# Patient Record
Sex: Male | Born: 1955 | Race: White | Hispanic: No | Marital: Married | Smoking: Never smoker
Health system: Southern US, Community
[De-identification: ages and names within clinical notes are randomized; demographics above are authoritative.]

## PROBLEM LIST (undated history)

## (undated) DIAGNOSIS — I10 Essential (primary) hypertension: Secondary | ICD-10-CM

## (undated) DIAGNOSIS — G4730 Sleep apnea, unspecified: Secondary | ICD-10-CM

## (undated) DIAGNOSIS — E7800 Pure hypercholesterolemia, unspecified: Secondary | ICD-10-CM

## (undated) DIAGNOSIS — E119 Type 2 diabetes mellitus without complications: Secondary | ICD-10-CM

---

## 2017-08-19 ENCOUNTER — Other Ambulatory Visit: Payer: Self-pay

## 2017-08-19 ENCOUNTER — Emergency Department (HOSPITAL_COMMUNITY): Payer: No Typology Code available for payment source

## 2017-08-19 ENCOUNTER — Encounter (HOSPITAL_COMMUNITY): Payer: Self-pay | Admitting: *Deleted

## 2017-08-19 ENCOUNTER — Inpatient Hospital Stay (HOSPITAL_COMMUNITY)
Admission: EM | Admit: 2017-08-19 | Discharge: 2017-08-22 | DRG: 872 | Disposition: A | Discharge status: Home / Self Care | Payer: No Typology Code available for payment source | Attending: Internal Medicine | Admitting: Internal Medicine

## 2017-08-19 DIAGNOSIS — G4733 Obstructive sleep apnea (adult) (pediatric): Secondary | ICD-10-CM | POA: Diagnosis present

## 2017-08-19 DIAGNOSIS — L03115 Cellulitis of right lower limb: Secondary | ICD-10-CM | POA: Diagnosis present

## 2017-08-19 DIAGNOSIS — N390 Urinary tract infection, site not specified: Secondary | ICD-10-CM | POA: Diagnosis not present

## 2017-08-19 DIAGNOSIS — E119 Type 2 diabetes mellitus without complications: Secondary | ICD-10-CM

## 2017-08-19 DIAGNOSIS — Z7984 Long term (current) use of oral hypoglycemic drugs: Secondary | ICD-10-CM

## 2017-08-19 DIAGNOSIS — Z7982 Long term (current) use of aspirin: Secondary | ICD-10-CM

## 2017-08-19 DIAGNOSIS — I10 Essential (primary) hypertension: Secondary | ICD-10-CM | POA: Diagnosis present

## 2017-08-19 DIAGNOSIS — A419 Sepsis, unspecified organism: Secondary | ICD-10-CM | POA: Diagnosis present

## 2017-08-19 DIAGNOSIS — E7800 Pure hypercholesterolemia, unspecified: Secondary | ICD-10-CM | POA: Diagnosis present

## 2017-08-19 DIAGNOSIS — N400 Enlarged prostate without lower urinary tract symptoms: Secondary | ICD-10-CM | POA: Diagnosis present

## 2017-08-19 DIAGNOSIS — Z6841 Body Mass Index (BMI) 40.0 and over, adult: Secondary | ICD-10-CM

## 2017-08-19 DIAGNOSIS — I4581 Long QT syndrome: Secondary | ICD-10-CM | POA: Diagnosis present

## 2017-08-19 DIAGNOSIS — Z79899 Other long term (current) drug therapy: Secondary | ICD-10-CM

## 2017-08-19 DIAGNOSIS — E876 Hypokalemia: Secondary | ICD-10-CM | POA: Diagnosis present

## 2017-08-19 DIAGNOSIS — G4730 Sleep apnea, unspecified: Secondary | ICD-10-CM | POA: Diagnosis present

## 2017-08-19 HISTORY — DX: Pure hypercholesterolemia, unspecified: E78.00

## 2017-08-19 HISTORY — DX: Essential (primary) hypertension: I10

## 2017-08-19 HISTORY — DX: Sleep apnea, unspecified: G47.30

## 2017-08-19 HISTORY — DX: Type 2 diabetes mellitus without complications: E11.9

## 2017-08-19 LAB — CBC
HCT: 41.7 % (ref 39.0–52.0)
Hemoglobin: 14.6 g/dL (ref 13.0–17.0)
MCH: 31 pg (ref 26.0–34.0)
MCHC: 35 g/dL (ref 30.0–36.0)
MCV: 88.5 fL (ref 78.0–100.0)
PLATELETS: 161 10*3/uL (ref 150–400)
RBC: 4.71 MIL/uL (ref 4.22–5.81)
RDW: 13.1 % (ref 11.5–15.5)
WBC: 20.1 10*3/uL — ABNORMAL HIGH (ref 4.0–10.5)

## 2017-08-19 LAB — BASIC METABOLIC PANEL
Anion gap: 10 (ref 5–15)
BUN: 18 mg/dL (ref 6–20)
CO2: 25 mmol/L (ref 22–32)
CREATININE: 0.94 mg/dL (ref 0.61–1.24)
Calcium: 9.4 mg/dL (ref 8.9–10.3)
Chloride: 103 mmol/L (ref 101–111)
GFR calc non Af Amer: >60 mL/min (ref >60)
Glucose, Bld: 193 mg/dL — ABNORMAL HIGH (ref 65–99)
Potassium: 3.4 mmol/L — ABNORMAL LOW (ref 3.5–5.1)
Sodium: 138 mmol/L (ref 135–145)

## 2017-08-19 LAB — URINALYSIS, ROUTINE W REFLEX MICROSCOPIC
Bilirubin Urine: NEGATIVE
Glucose, UA: NEGATIVE mg/dL
Hgb urine dipstick: NEGATIVE
KETONES UR: NEGATIVE mg/dL
Nitrite: NEGATIVE
PH: 5 (ref 5.0–8.0)
PROTEIN: NEGATIVE mg/dL
Specific Gravity, Urine: 1.017 (ref 1.005–1.030)

## 2017-08-19 LAB — CBG MONITORING, ED: GLUCOSE-CAPILLARY: 191 mg/dL — AB (ref 65–99)

## 2017-08-19 MED ORDER — SODIUM CHLORIDE 0.9 % IV BOLUS (SEPSIS)
1000.0000 mL | Freq: Once | INTRAVENOUS | Status: AC
  Administered 2017-08-20: 1000 mL via INTRAVENOUS

## 2017-08-19 MED ORDER — SODIUM CHLORIDE 0.9 % IV SOLN
2.0000 g | INTRAVENOUS | Status: DC
  Administered 2017-08-20: 2 g via INTRAVENOUS
  Filled 2017-08-19: qty 20

## 2017-08-19 MED ORDER — SODIUM CHLORIDE 0.9 % IV BOLUS (SEPSIS): 1000.0000 mL | Freq: Once | INTRAVENOUS | Status: DC

## 2017-08-19 NOTE — ED Notes (Signed)
Pt has blue top and gold top in main lab if needed

## 2017-08-19 NOTE — ED Triage Notes (Signed)
Pt's family reports waking up this am with no energy and weakness.  Pt reports nausea and fever.  Went to the UC with a 102 fever.  He reports having chills all day today.  Pt's family reports having his urine collected at the UC and was found to have WBC in his urine and instructed him to come to the ED.  Reports the UC heard "crackling in his lungs".  She reports pt having SOB as well.

## 2017-08-19 NOTE — ED Notes (Signed)
Pt reported having a fever of 102 at UC and they did not receive any medicine for the fever. Pt afebrile upon arrival. Pt tachypnic at 30-38 RR. NS on monitor.

## 2017-08-20 ENCOUNTER — Other Ambulatory Visit: Payer: Self-pay

## 2017-08-20 ENCOUNTER — Inpatient Hospital Stay (HOSPITAL_COMMUNITY): Payer: No Typology Code available for payment source

## 2017-08-20 DIAGNOSIS — A419 Sepsis, unspecified organism: Secondary | ICD-10-CM | POA: Diagnosis present

## 2017-08-20 DIAGNOSIS — N400 Enlarged prostate without lower urinary tract symptoms: Secondary | ICD-10-CM | POA: Diagnosis present

## 2017-08-20 DIAGNOSIS — I10 Essential (primary) hypertension: Secondary | ICD-10-CM | POA: Diagnosis present

## 2017-08-20 DIAGNOSIS — N3000 Acute cystitis without hematuria: Secondary | ICD-10-CM | POA: Diagnosis not present

## 2017-08-20 DIAGNOSIS — E7800 Pure hypercholesterolemia, unspecified: Secondary | ICD-10-CM | POA: Diagnosis present

## 2017-08-20 DIAGNOSIS — Z7982 Long term (current) use of aspirin: Secondary | ICD-10-CM | POA: Diagnosis not present

## 2017-08-20 DIAGNOSIS — Z79899 Other long term (current) drug therapy: Secondary | ICD-10-CM | POA: Diagnosis not present

## 2017-08-20 DIAGNOSIS — I4581 Long QT syndrome: Secondary | ICD-10-CM | POA: Diagnosis present

## 2017-08-20 DIAGNOSIS — G4730 Sleep apnea, unspecified: Secondary | ICD-10-CM | POA: Diagnosis not present

## 2017-08-20 DIAGNOSIS — E876 Hypokalemia: Secondary | ICD-10-CM | POA: Diagnosis present

## 2017-08-20 DIAGNOSIS — E119 Type 2 diabetes mellitus without complications: Secondary | ICD-10-CM | POA: Diagnosis present

## 2017-08-20 DIAGNOSIS — Z6841 Body Mass Index (BMI) 40.0 and over, adult: Secondary | ICD-10-CM | POA: Diagnosis not present

## 2017-08-20 DIAGNOSIS — N390 Urinary tract infection, site not specified: Secondary | ICD-10-CM | POA: Diagnosis present

## 2017-08-20 DIAGNOSIS — G4733 Obstructive sleep apnea (adult) (pediatric): Secondary | ICD-10-CM | POA: Diagnosis present

## 2017-08-20 DIAGNOSIS — Z7984 Long term (current) use of oral hypoglycemic drugs: Secondary | ICD-10-CM | POA: Diagnosis not present

## 2017-08-20 DIAGNOSIS — R609 Edema, unspecified: Secondary | ICD-10-CM | POA: Diagnosis not present

## 2017-08-20 DIAGNOSIS — L03115 Cellulitis of right lower limb: Secondary | ICD-10-CM | POA: Diagnosis present

## 2017-08-20 LAB — HIV ANTIBODY (ROUTINE TESTING W REFLEX): HIV SCREEN 4TH GENERATION: NONREACTIVE

## 2017-08-20 LAB — BLOOD GAS, VENOUS
ACID-BASE EXCESS: 1.1 mmol/L (ref 0.0–2.0)
BICARBONATE: 25.7 mmol/L (ref 20.0–28.0)
FIO2: 21
O2 SAT: 33 %
PATIENT TEMPERATURE: 98.6
pCO2, Ven: 43.3 mmHg — ABNORMAL LOW (ref 44.0–60.0)
pH, Ven: 7.392 (ref 7.250–7.430)

## 2017-08-20 LAB — I-STAT CG4 LACTIC ACID, ED
Lactic Acid, Venous: 2.85 mmol/L (ref 0.5–1.9)
Lactic Acid, Venous: 3.18 mmol/L (ref 0.5–1.9)

## 2017-08-20 LAB — BRAIN NATRIURETIC PEPTIDE: B Natriuretic Peptide: 178.4 pg/mL — ABNORMAL HIGH (ref 0.0–100.0)

## 2017-08-20 LAB — BASIC METABOLIC PANEL
Anion gap: 12 (ref 5–15)
BUN: 18 mg/dL (ref 6–20)
CO2: 27 mmol/L (ref 22–32)
Calcium: 9.5 mg/dL (ref 8.9–10.3)
Chloride: 101 mmol/L (ref 101–111)
Creatinine, Ser: 1.08 mg/dL (ref 0.61–1.24)
GFR calc Af Amer: >60 mL/min (ref >60)
GFR calc non Af Amer: >60 mL/min (ref >60)
GLUCOSE: 200 mg/dL — AB (ref 65–99)
POTASSIUM: 3.6 mmol/L (ref 3.5–5.1)
Sodium: 140 mmol/L (ref 135–145)

## 2017-08-20 LAB — CBC
HEMATOCRIT: 41.2 % (ref 39.0–52.0)
Hemoglobin: 14 g/dL (ref 13.0–17.0)
MCH: 30.8 pg (ref 26.0–34.0)
MCHC: 34 g/dL (ref 30.0–36.0)
MCV: 90.5 fL (ref 78.0–100.0)
Platelets: 164 10*3/uL (ref 150–400)
RBC: 4.55 MIL/uL (ref 4.22–5.81)
RDW: 13.5 % (ref 11.5–15.5)
WBC: 21.3 10*3/uL — ABNORMAL HIGH (ref 4.0–10.5)

## 2017-08-20 LAB — CBG MONITORING, ED
GLUCOSE-CAPILLARY: 166 mg/dL — AB (ref 65–99)
GLUCOSE-CAPILLARY: 154 mg/dL — AB (ref 65–99)

## 2017-08-20 LAB — MAGNESIUM: Magnesium: 1.7 mg/dL (ref 1.7–2.4)

## 2017-08-20 LAB — GLUCOSE, CAPILLARY
GLUCOSE-CAPILLARY: 149 mg/dL — AB (ref 65–99)
GLUCOSE-CAPILLARY: 178 mg/dL — AB (ref 65–99)

## 2017-08-20 LAB — MRSA PCR SCREENING: MRSA by PCR: NEGATIVE

## 2017-08-20 LAB — LACTIC ACID, PLASMA: Lactic Acid, Venous: 3.1 mmol/L (ref 0.5–1.9)

## 2017-08-20 MED ORDER — ONDANSETRON HCL 4 MG PO TABS: 4.0000 mg | ORAL_TABLET | Freq: Four times a day (QID) | ORAL | Status: DC | PRN

## 2017-08-20 MED ORDER — VANCOMYCIN HCL IN DEXTROSE 1-5 GM/200ML-% IV SOLN
1000.0000 mg | Freq: Once | INTRAVENOUS | Status: AC
  Administered 2017-08-20: 1000 mg via INTRAVENOUS
  Filled 2017-08-20: qty 200

## 2017-08-20 MED ORDER — INSULIN ASPART 100 UNIT/ML ~~LOC~~ SOLN
0.0000 [IU] | Freq: Three times a day (TID) | SUBCUTANEOUS | Status: DC
  Administered 2017-08-20: 2 [IU] via SUBCUTANEOUS
  Administered 2017-08-20: 1 [IU] via SUBCUTANEOUS
  Administered 2017-08-20: 2 [IU] via SUBCUTANEOUS
  Administered 2017-08-21 (×2): 1 [IU] via SUBCUTANEOUS
  Administered 2017-08-21: 2 [IU] via SUBCUTANEOUS
  Administered 2017-08-22: 1 [IU] via SUBCUTANEOUS
  Administered 2017-08-22: 2 [IU] via SUBCUTANEOUS
  Filled 2017-08-20 (×2): qty 1

## 2017-08-20 MED ORDER — MAGNESIUM SULFATE 2 GM/50ML IV SOLN
2.0000 g | Freq: Once | INTRAVENOUS | Status: AC
  Administered 2017-08-20: 2 g via INTRAVENOUS
  Filled 2017-08-20: qty 50

## 2017-08-20 MED ORDER — SODIUM CHLORIDE 0.9 % IV BOLUS
1000.0000 mL | Freq: Once | INTRAVENOUS | Status: DC
  Administered 2017-08-20: 1000 mL via INTRAVENOUS

## 2017-08-20 MED ORDER — POTASSIUM CHLORIDE IN NACL 20-0.9 MEQ/L-% IV SOLN: INTRAVENOUS | Status: DC

## 2017-08-20 MED ORDER — ONDANSETRON HCL 4 MG/2ML IJ SOLN: 4.0000 mg | Freq: Four times a day (QID) | INTRAMUSCULAR | Status: DC | PRN

## 2017-08-20 MED ORDER — LISINOPRIL 20 MG PO TABS
40.0000 mg | ORAL_TABLET | Freq: Every day | ORAL | Status: DC
  Administered 2017-08-20 – 2017-08-21 (×2): 40 mg via ORAL
  Filled 2017-08-20 (×3): qty 2

## 2017-08-20 MED ORDER — ROSUVASTATIN CALCIUM 20 MG PO TABS
20.0000 mg | ORAL_TABLET | Freq: Every day | ORAL | Status: DC
Start: 2017-08-20 — End: 2017-08-22
  Administered 2017-08-20 – 2017-08-21 (×2): 20 mg via ORAL
  Filled 2017-08-20 (×3): qty 1

## 2017-08-20 MED ORDER — SODIUM CHLORIDE 0.9 % IV BOLUS
500.0000 mL | Freq: Once | INTRAVENOUS | Status: AC
  Administered 2017-08-20: 500 mL via INTRAVENOUS

## 2017-08-20 MED ORDER — POTASSIUM CHLORIDE CRYS ER 20 MEQ PO TBCR
40.0000 meq | EXTENDED_RELEASE_TABLET | Freq: Once | ORAL | Status: AC
  Administered 2017-08-20: 40 meq via ORAL
  Filled 2017-08-20: qty 2

## 2017-08-20 MED ORDER — ACETAMINOPHEN 325 MG PO TABS: 650.0000 mg | ORAL_TABLET | Freq: Four times a day (QID) | ORAL | Status: DC | PRN

## 2017-08-20 MED ORDER — VENLAFAXINE HCL ER 150 MG PO CP24
150.0000 mg | ORAL_CAPSULE | Freq: Every day | ORAL | Status: DC
  Administered 2017-08-20 – 2017-08-22 (×3): 150 mg via ORAL
  Filled 2017-08-20: qty 2
  Filled 2017-08-20 (×2): qty 1

## 2017-08-20 MED ORDER — DULAGLUTIDE 1.5 MG/0.5ML ~~LOC~~ SOAJ: 1.5000 mg | SUBCUTANEOUS | Status: DC

## 2017-08-20 MED ORDER — ENOXAPARIN SODIUM 80 MG/0.8ML ~~LOC~~ SOLN
0.5000 mg/kg | SUBCUTANEOUS | Status: DC
  Administered 2017-08-21 – 2017-08-22 (×2): 75 mg via SUBCUTANEOUS
  Filled 2017-08-20 (×2): qty 0.8
  Filled 2017-08-20: qty 0.75

## 2017-08-20 MED ORDER — VENLAFAXINE HCL ER 75 MG PO CP24: 75.0000 mg | ORAL_CAPSULE | ORAL | Status: DC

## 2017-08-20 MED ORDER — TAMSULOSIN HCL 0.4 MG PO CAPS
0.4000 mg | ORAL_CAPSULE | Freq: Every day | ORAL | Status: DC
  Administered 2017-08-20 – 2017-08-21 (×2): 0.4 mg via ORAL
  Filled 2017-08-20 (×2): qty 1

## 2017-08-20 MED ORDER — VANCOMYCIN HCL 10 G IV SOLR
1250.0000 mg | Freq: Two times a day (BID) | INTRAVENOUS | Status: DC
  Administered 2017-08-20 – 2017-08-22 (×5): 1250 mg via INTRAVENOUS
  Filled 2017-08-20 (×5): qty 1250

## 2017-08-20 MED ORDER — ACETAMINOPHEN 500 MG PO TABS
1000.0000 mg | ORAL_TABLET | Freq: Once | ORAL | Status: AC
  Administered 2017-08-20: 1000 mg via ORAL
  Filled 2017-08-20: qty 2

## 2017-08-20 MED ORDER — TAMSULOSIN HCL 0.4 MG PO CAPS
0.1600 mg | ORAL_CAPSULE | Freq: Every day | ORAL | Status: DC
  Filled 2017-08-20: qty 1

## 2017-08-20 MED ORDER — SODIUM CHLORIDE 0.9 % IV SOLN
2.0000 g | INTRAVENOUS | Status: DC
  Administered 2017-08-20 – 2017-08-21 (×2): 2 g via INTRAVENOUS
  Filled 2017-08-20 (×2): qty 2

## 2017-08-20 MED ORDER — FINASTERIDE 5 MG PO TABS
5.0000 mg | ORAL_TABLET | Freq: Every day | ORAL | Status: DC
  Administered 2017-08-20 – 2017-08-21 (×2): 5 mg via ORAL
  Filled 2017-08-20 (×3): qty 1

## 2017-08-20 MED ORDER — VENLAFAXINE HCL ER 75 MG PO CP24
75.0000 mg | ORAL_CAPSULE | Freq: Every day | ORAL | Status: DC
Start: 2017-08-20 — End: 2017-08-22
  Administered 2017-08-20 – 2017-08-21 (×2): 75 mg via ORAL
  Filled 2017-08-20 (×2): qty 1

## 2017-08-20 NOTE — ED Notes (Addendum)
Spoke with [facility] hospitalist who was made aware of LA 3.1; per hospitalist is reviewing chart for future needs/orders.

## 2017-08-20 NOTE — Progress Notes (Signed)
[name] is a 62 year old morbidly obese male with past medical history significant for type 2 diabetes, hypertension, BPH, who presented to the ED with complaints of fever chills dysuria and worsening urinary incontinence of 1 day duration.  Patient resides in [place] and is visiting here.  Admitted for sepsis secondary to UTI.  On examination this morning patient appears to have right lower extremity cellulitis.  Currently on IV vancomycin and IV ceftriaxone, will continue.  MRSA screening ordered.  Renal ultrasound unremarkable for hydronephrosis.  Personally reviewed EKG which revealed QTC 513.  Repeated twelve-lead EKG this morning.  No prior records of 2D echo.  Ordered BNP.  Self-reported having a stress test done 2 years ago which was unremarkable.  Please refer to H&P dictated by Dr. Mariea Craig on 08/20/2017 for further details of the assessment and plan..Marland Kitchen

## 2017-08-20 NOTE — ED Notes (Signed)
Date and time results received: 08/20/17 7:17 AM  Test: lactic acid Critical Value: 3.1  Name of Provider Notified: hospitalist paged  Orders Received? Or Actions Taken?: awaiting response

## 2017-08-20 NOTE — Progress Notes (Signed)
Pharmacy Antibiotic Note  [place] is a 62 y.o. male admitted on 08/19/2017 with sepsis.  Pharmacy has been consulted for vancomycin dosing.  Plan: Rocephin 2 gm IV q24h Vancomycin 1 Gm x1 then 1250 mg IV q12h for est AUC = 528 Using scr=1.08 Goal AUC = 400-500 F/u scr/cultures/evels  Height: 6' (182.9 cm) Weight: (!) 340 lb (154.2 kg) IBW/kg (Calculated) : 77.6  Temp (24hrs), Avg:100.2 F (37.9 C), Min:98.3 F (36.8 C), Max:102 F (38.9 C)  Recent Labs  Lab 08/19/17 2004 08/20/17 0026 08/20/17 0145  WBC 20.1*  --   --   CREATININE 0.94  --   --   LATICACIDVEN  --  3.18* 2.85*    Estimated Creatinine Clearance: 123.1 mL/min (by C-G formula based on SCr of 0.94 mg/dL).    Allergies  Allergen Reactions  . Bupropion Hives  . Canagliflozin     Other reaction(s): Other; see comment Yeast infections    Antimicrobials this admission: 6/11 rocephin >>  6/12 vancomycin >>   Dose adjustments this admission:   Microbiology results:  BCx:   UCx:    Sputum:    MRSA PCR:   Thank you for allowing pharmacy to be a part of this patient's care.  Lorenza EvangelistGreen, Jandiel Magallanes R 08/20/2017 4:09 AM

## 2017-08-20 NOTE — ED Notes (Signed)
Bed: WA29 Expected date:  Expected time:  Means of arrival:  Comments: ROOM 16

## 2017-08-20 NOTE — ED Provider Notes (Addendum)
[facility]-EMERGENCY DEPT Provider Note   CSN: [id number] Arrival date & time: 08/19/17  1903  Time seen 23:19 PM    History   Chief Complaint Chief Complaint  Patient presents with  . Weakness    HPI [place] is a 62 y.o. male.  HPI history obtained from patient and his wife.  She relates [place] from [place] on [place] 5 to attend a wedding on the seventh.  Patient has been doing well however this morning he started having chills that lasted for a couple hours.  He also complained of aching of his bones and hurting all over.  She states she noted he was taking shallow breaths and he had a mild cough and he told her he felt short of breath.  They were seen at an urgent care at 5 PM and were told he had white blood cells in the urine and that his lungs "sounded crackly" and they were told to come to the ED.  He complains of feeling weak.  He has had decreased appetite today.  He denies sore throat, rhinorrhea, sneezing, vomiting, or diarrhea.  He has had mild nausea.  He also however complains of dysuria and difficulty controlling his urine over the past 2 weeks.  He also describes frequency.  He has a history of prostate problems and he has been on tamsulosin for a while however his urologist added finasteride a few weeks ago.  Wife states patient has had sepsis twice in the past.  She states once they were in [place] and he [place] his leg at the zoo and she states he was very sick at that time.  He also had sepsis while they were at home and he was not as bad that time.  She states they did not find a source of the infection but he had a "infection in the bloodstream" and she thinks she remembers them saying he had staff.  Patient uses CPAP at night.  Past Medical History:  Diagnosis Date  . Diabetes mellitus without complication (HCC)   . Hypercholesterolemia   . Hypertension   . Sleep apnea     There are no active problems to display for this  patient.   History reviewed. No pertinent surgical history.      Home Medications    Prior to Admission medications   Medication Sig Start Date End Date Taking? Authorizing Provider  amLODipine (NORVASC) 10 MG tablet Take 10 mg by mouth at bedtime. 07/31/17  Yes [provider]  aspirin (ECOTRIN LOW STRENGTH) 81 MG EC tablet Take 81 mg by mouth at bedtime. 11/20/12  Yes [provider]  b complex vitamins tablet Take 1 tablet by mouth daily.   Yes [provider]  CALCIUM PO Take 1 tablet by mouth daily.   Yes [provider]  cholecalciferol (VITAMIN D) 1000 units tablet Take 5,000 Units by mouth daily.   Yes [provider]  Coenzyme Q10 (COQ-10) 400 MG CAPS Take 400 mg by mouth daily.   Yes [provider]  finasteride (PROSCAR) 5 MG tablet Take 5 mg by mouth at bedtime. 08/01/17  Yes [provider]  glucosamine-chondroitin 500-400 MG tablet Take 1 tablet by mouth daily.   Yes [provider]  hydrochlorothiazide (HYDRODIURIL) 25 MG tablet Take 25 mg by mouth daily. 08/09/17  Yes [provider]  Lactobacillus (ACIDOPHILUS) CAPS capsule Take 1 capsule by mouth daily.   Yes [provider]  lisinopril (  PRINIVIL,ZESTRIL) 40 MG tablet Take 40 mg by mouth at bedtime. 07/31/17  Yes [provider]  LUTEIN PO Take 1 capsule by mouth daily.   Yes [provider]  MAGNESIUM-POTASSIUM PO Take 3 capsules by mouth daily.   Yes [provider]  metFORMIN (GLUCOPHAGE-XR) 500 MG 24 hr tablet Take 2,000 mg by mouth every morning. 07/31/17  Yes [provider]  Misc Natural Products (BETA-SITOSTEROL PLANT STEROLS PO) Take 1 capsule by mouth at bedtime.   Yes [provider]  Omega-3 Fatty Acids (FISH OIL) 1000 MG CAPS Take 2 capsules by mouth daily.   Yes [provider]  psyllium (REGULOID) 0.52 g capsule Take 2.6 g by mouth daily.   Yes [provider]    rosuvastatin (CRESTOR) 20 MG tablet Take 20 mg by mouth daily. 07/04/17  Yes [provider]  tamsulosin (FLOMAX) 0.4 MG CAPS capsule Take 0.4 capsules by mouth at bedtime. 04/07/17  Yes [provider]  TRULICITY 1.5 MG/0.5ML SOPN Inject 1.5 mg into the skin once a week. 07/28/17  Yes [provider]  venlafaxine XR (EFFEXOR-XR) 75 MG 24 hr capsule Take 1-3 capsules by mouth as directed. 2 capsule in the AM and 1 capsule at night 07/04/17  Yes [provider]  zinc gluconate 50 MG tablet Take 50 mg by mouth daily.   Yes [provider]    Family History No family history on file.  Social History Social History   Tobacco Use  . Smoking status: Never Smoker  . Smokeless tobacco: Never Used  Substance Use Topics  . Alcohol use: Never    Frequency: Never  . Drug use: Never  lives at home  Lives with spouse Uses CPAP at night   Allergies   Bupropion and Canagliflozin   Review of Systems Review of Systems  All other systems reviewed and are negative.    Physical Exam Updated Vital Signs BP (!) 161/74   Pulse 94   Temp (!) 102 F (38.9 C) (Rectal)   Resp 15   Ht 6' (1.829 m)   Wt (!) 154.2 kg (340 lb)   SpO2 100%   BMI 46.11 kg/m   Vital signs normal except for tachycardia   Physical Exam  Constitutional: He is oriented to person, place, and time. He appears well-developed and well-nourished.  Non-toxic appearance. He does not appear ill. No distress.  obese  HENT:  Head: Normocephalic and atraumatic.  Right Ear: External ear normal.  Left Ear: External ear normal.  Nose: Nose normal. No mucosal edema or rhinorrhea.  Mouth/Throat: Oropharynx is clear and moist and mucous membranes are normal. No dental abscesses or uvula swelling.  Eyes: Pupils are equal, round, and reactive to light. Conjunctivae and EOM are normal.  Neck: Normal range of motion and full passive range of motion without pain. Neck supple.   Cardiovascular: Regular rhythm and normal heart sounds. Tachycardia present. Exam reveals no gallop and no friction rub.  No murmur heard. Pulmonary/Chest: Effort normal and breath sounds normal. Tachypnea noted. No respiratory distress. He has no wheezes. He has no rhonchi. He has no rales. He exhibits no tenderness and no crepitus.  Abdominal: Soft. Normal appearance and bowel sounds are normal. He exhibits no distension. There is no tenderness. There is no rebound and no guarding.  Musculoskeletal: Normal range of motion. He exhibits no edema or tenderness.  Moves all extremities well.   Neurological: He is alert and oriented to person, place, and time.  He has normal strength. No cranial nerve deficit.  Skin: Skin is warm, dry and intact. No rash noted. No erythema.  Pt's skin is grayish in color  Psychiatric: His affect is blunt. His speech is delayed. He is slowed.  Nursing note and vitals reviewed.    ED Treatments / Results  Labs (all labs ordered are listed, but only abnormal results are displayed) Results for orders placed or performed during the hospital encounter of 08/19/17  Basic metabolic panel  Result Value Ref Range   Sodium 138 135 - 145 mmol/L   Potassium 3.4 (L) 3.5 - 5.1 mmol/L   Chloride 103 101 - 111 mmol/L   CO2 25 22 - 32 mmol/L   Glucose, Bld 193 (H) 65 - 99 mg/dL   BUN 18 6 - 20 mg/dL   Creatinine, Ser 4.09 0.61 - 1.24 mg/dL   Calcium 9.4 8.9 - 81.1 mg/dL   GFR calc non Af Amer >60 >60 mL/min   GFR calc Af Amer >60 >60 mL/min   Anion gap 10 5 - 15  CBC  Result Value Ref Range   WBC 20.1 (H) 4.0 - 10.5 K/uL   RBC 4.71 4.22 - 5.81 MIL/uL   Hemoglobin 14.6 13.0 - 17.0 g/dL   HCT 91.4 78.2 - 95.6 %   MCV 88.5 78.0 - 100.0 fL   MCH 31.0 26.0 - 34.0 pg   MCHC 35.0 30.0 - 36.0 g/dL   RDW 21.3 08.6 - 57.8 %   Platelets 161 150 - 400 K/uL  Urinalysis, Routine w reflex microscopic  Result Value Ref Range   Color, Urine AMBER (A) YELLOW   APPearance  CLOUDY (A) CLEAR   Specific Gravity, Urine 1.017 1.005 - 1.030   pH 5.0 5.0 - 8.0   Glucose, UA NEGATIVE NEGATIVE mg/dL   Hgb urine dipstick NEGATIVE NEGATIVE   Bilirubin Urine NEGATIVE NEGATIVE   Ketones, ur NEGATIVE NEGATIVE mg/dL   Protein, ur NEGATIVE NEGATIVE mg/dL   Nitrite NEGATIVE NEGATIVE   Leukocytes, UA LARGE (A) NEGATIVE   RBC / HPF 6-10 0 - 5 RBC/hpf   WBC, UA >50 (H) 0 - 5 WBC/hpf   Bacteria, UA RARE (A) NONE SEEN   Squamous Epithelial / LPF 0-5 0 - 5   Mucus PRESENT   Blood gas, venous  Result Value Ref Range   FIO2 21.00    pH, Ven 7.392 7.250 - 7.430   pCO2, Ven 43.3 (L) 44.0 - 60.0 mmHg   pO2, Ven BELOW REPORTABLE RANGE 32.0 - 45.0 mmHg   Bicarbonate 25.7 20.0 - 28.0 mmol/L   Acid-Base Excess 1.1 0.0 - 2.0 mmol/L   O2 Saturation 33.0 %   Patient temperature 98.6    Collection site VEIN    Drawn by DRAWN BY RN    Sample type VENOUS   CBG monitoring, ED  Result Value Ref Range   Glucose-Capillary 191 (H) 65 - 99 mg/dL   Comment 1 Notify RN   I-Stat CG4 Lactic Acid, ED  (not at  [facility])  Result Value Ref Range   Lactic Acid, Venous 3.18 (HH) 0.5 - 1.9 mmol/L   Comment NOTIFIED PHYSICIAN   I-Stat CG4 Lactic Acid, ED  (not at  [facility])  Result Value Ref Range   Lactic Acid, Venous 2.85 (HH) 0.5 - 1.9 mmol/L   Comment NOTIFIED PHYSICIAN    Laboratory interpretation all normal except leukocytosis, possible UTI, elevated lactic acid, vBG is normal, improving LA    EKG EKG Interpretation  Date/Time:  Tuesday August 19 2017 19:52:22 EDT Ventricular Rate:  99 PR Interval:    QRS Duration: 96 QT Interval:  399 QTC Calculation: 513 R Axis:   142 Text Interpretation:  Sinus rhythm Right axis deviation Minimal ST elevation, anterior leads Prolonged QT interval No old tracing to compare Confirmed by Devoria Albe ([id number]) on 08/19/2017 11:28:18 PM   Radiology Dg Chest Port 1 View  Result Date: 08/20/2017 CLINICAL DATA:  Fever and sepsis.  Dyspnea. EXAM: PORTABLE  CHEST 1 VIEW COMPARISON:  None FINDINGS: Eventration of the right hemidiaphragm. Heart is top-normal in size. Minimal aortic atherosclerosis and mild central vascular congestion is seen. No pulmonary consolidation, effusion or pneumothorax is noted. The patient's chin obscures portions of the apices medially. No acute osseous abnormality. IMPRESSION: Borderline cardiomegaly with mild central vascular congestion. No alveolar consolidation. Electronically Signed   By: Tollie Eth M.D.   On: 08/20/2017 00:03    Procedures .Critical Care Performed by: Devoria Albe, MD Authorized by: Devoria Albe, MD   Critical care provider statement:    Critical care time (minutes):  36   Critical care was necessary to treat or prevent imminent or life-threatening deterioration of the following conditions:  Circulatory failure   Critical care was time spent personally by me on the following activities:  Discussions with consultants, examination of patient, obtaining history from patient or surrogate, ordering and review of laboratory studies, ordering and review of radiographic studies, pulse oximetry and re-evaluation of patient's condition   (including critical care time)  Medications Ordered in ED Medications  cefTRIAXone (ROCEPHIN) 2 g in sodium chloride 0.9 % 100 mL IVPB (0 g Intravenous Stopped 08/20/17 0047)  vancomycin (VANCOCIN) IVPB 1000 mg/200 mL premix (1,000 mg Intravenous New Bag/Given 08/20/17 0054)  sodium chloride 0.9 % bolus 1,000 mL (0 mLs Intravenous Stopped 08/20/17 0142)    And  sodium chloride 0.9 % bolus 1,000 mL (1,000 mLs Intravenous New Bag/Given 08/20/17 0022)    And  sodium chloride 0.9 % bolus 1,000 mL ( Intravenous Restarted 08/20/17 0018)    And  sodium chloride 0.9 % bolus 1,000 mL (1,000 mLs Intravenous New Bag/Given 08/20/17 0017)  acetaminophen (TYLENOL) tablet 1,000 mg (1,000 mg Oral Given 08/20/17 0054)     Initial Impression / Assessment and Plan / ED Course  I have reviewed  the triage vital signs and the nursing notes.  Pertinent labs & imaging results that were available during my care of the patient were reviewed by me and considered in my medical decision making (see chart for details).    When I saw patient code sepsis was ordered.  He was started on IV fluid bolus and started on antibiotics for urinary tract as source.  Chest x-ray was ordered to look for pulmonary source however I feel like his tachypnea as part of his sepsis and infection.  After reviewing his laboratory test results and thinking about his history vancomycin was added to his antibiotic regimen since the wife indicated he had staph in the past.  12:45 AM Dr Hosie Poisson, Critical Care, feels patient can be admitted by hospitalist.   01:18 AM Dr Mariea Clonts, hospitalist, will admit  01:25 AM pt color has improved, he is more alert and talkative now. Discussed his test results and he was being admitted. Wife has brought his CPAP to hospital.   Final Clinical Impressions(s) / ED Diagnoses   Final diagnoses:  Sepsis, due to unspecified organism [facility])  Urinary tract infection without hematuria, site unspecified  Plan admission   Devoria Albe, MD 08/20/17 2130    Devoria Albe, MD 08/20/17 Drinda Butts, MD 08/20/17 [phone]

## 2017-08-20 NOTE — H&P (Signed)
History and Physical    Carlos Craig ZOX:[id number] DOB: February 19, 1956 DOA: 08/19/2017  PCP: System, Pcp Not In   Patient coming from: Home  Chief Complaint: fever, dysuria  HPI: Carlos Craig is a 62 y.o. male with medical history significant for DM, HTN, BPH who presented to the ED today with complaints of fever and chills that started this morning.  Also dysuria and urinary frequency of 2 weeks duration.  Reports nausea but no vomiting, no abdominal pain change in bowel habits. Reports generalized body aches.  Patient reports some shortness of breath earlier which has resolved.  No cough.  Was seen at urgent care, yesterday , was subsequently sent to the ED.  [place] in [place], came down here- June 5th for a wedding, initially planned to travel back tomorrow. Denies alcohol or tobacco abuse.  Patient reports history of sepsis  A few years ago, was told it was staph.  ED Course: Temp 102, heart rate 80s to 102, tachypnea documented to 40, blood pressure systolic 130s to 811B, O2 sats greater than 94% on room air.  WBC- 20, hemoglobin 14.6, UA-large leukocytes, WBC, rare bacteria, portable chest x-ray-borderline cardiomegaly, mild central vascular congestion, no alveolar consolidation.  Blood cultures drawn in ED. patient was started on IV vancomycin(with history of staph infection) and ceftriaxone. 4L bolus given in ED. Hospitalist called to admit for sepsis secondary to UTI.   Review of Systems: As per HPI otherwise 10 point review of systems negative.  Past Medical History:  Diagnosis Date  . Diabetes mellitus without complication (HCC)   . Hypercholesterolemia   . Hypertension   . Sleep apnea    History reviewed. No pertinent surgical history.   reports that he has never smoked. He has never used smokeless tobacco. He reports that he does not drink alcohol or use drugs.  Allergies  Allergen Reactions  . Bupropion Hives  . Canagliflozin     Other reaction(s): Other; see  comment Yeast infections   Family history noncontributory.  Prior to Admission medications   Medication Sig Start Date End Date Taking? Authorizing Provider  amLODipine (NORVASC) 10 MG tablet Take 10 mg by mouth at bedtime. 07/31/17  Yes [provider]  aspirin (ECOTRIN LOW STRENGTH) 81 MG EC tablet Take 81 mg by mouth at bedtime. 11/20/12  Yes [provider]  b complex vitamins tablet Take 1 tablet by mouth daily.   Yes [provider]  CALCIUM PO Take 1 tablet by mouth daily.   Yes [provider]  cholecalciferol (VITAMIN D) 1000 units tablet Take 5,000 Units by mouth daily.   Yes [provider]  Coenzyme Q10 (COQ-10) 400 MG CAPS Take 400 mg by mouth daily.   Yes [provider]  finasteride (PROSCAR) 5 MG tablet Take 5 mg by mouth at bedtime. 08/01/17  Yes [provider]  glucosamine-chondroitin 500-400 MG tablet Take 1 tablet by mouth daily.   Yes [provider]  hydrochlorothiazide (HYDRODIURIL) 25 MG tablet Take 25 mg by mouth daily. 08/09/17  Yes [provider]  Lactobacillus (ACIDOPHILUS) CAPS capsule Take 1 capsule by mouth daily.   Yes [provider]  lisinopril (PRINIVIL,ZESTRIL) 40 MG tablet Take 40 mg by mouth at bedtime. 07/31/17  Yes [provider]  LUTEIN PO Take 1 capsule by mouth daily.   Yes [provider]  MAGNESIUM-POTASSIUM PO Take 3 capsules by mouth daily.   Yes [provider]  metFORMIN (GLUCOPHAGE-XR) 500 MG 24 hr tablet Take  2,000 mg by mouth every morning. 07/31/17  Yes [provider]  Misc Natural Products (BETA-SITOSTEROL PLANT STEROLS PO) Take 1 capsule by mouth at bedtime.   Yes [provider]  Omega-3 Fatty Acids (FISH OIL) 1000 MG CAPS Take 2 capsules by mouth daily.   Yes [provider]  psyllium (REGULOID) 0.52 g capsule Take 2.6 g by mouth daily.   Yes [provider]  rosuvastatin (CRESTOR) 20 MG  tablet Take 20 mg by mouth daily. 07/04/17  Yes [provider]  tamsulosin (FLOMAX) 0.4 MG CAPS capsule Take 0.4 capsules by mouth at bedtime. 04/07/17  Yes [provider]  TRULICITY 1.5 MG/0.5ML SOPN Inject 1.5 mg into the skin once a week. 07/28/17  Yes [provider]  venlafaxine XR (EFFEXOR-XR) 75 MG 24 hr capsule Take 1-3 capsules by mouth as directed. 2 capsule in the AM and 1 capsule at night 07/04/17  Yes [provider]  zinc gluconate 50 MG tablet Take 50 mg by mouth daily.   Yes [provider]    Physical Exam: Vitals:   08/20/17 0015 08/20/17 0030 08/20/17 0126 08/20/17 0251  BP:  (!) 156/79 132/67 140/72  Pulse:  88 (!) 102 85  Resp:  (!) 40 (!) 37 (!) 28  Temp: (!) 102 F (38.9 C)     TempSrc: Rectal     SpO2:  96% 96% 94%  Weight:      Height:        Constitutional:  calm, comfortable, morbidly obese Vitals:   08/20/17 0015 08/20/17 0030 08/20/17 0126 08/20/17 0251  BP:  (!) 156/79 132/67 140/72  Pulse:  88 (!) 102 85  Resp:  (!) 40 (!) 37 (!) 28  Temp: (!) 102 F (38.9 C)     TempSrc: Rectal     SpO2:  96% 96% 94%  Weight:      Height:       Eyes: PERRL, lids and conjunctivae normal ENMT: Mucous membranes are dry. Posterior pharynx clear of any exudate or lesions.Normal dentition.  Neck: normal, supple, no masses, no thyromegaly Respiratory: clear to auscultation bilaterally, no wheezing, no crackles. Normal respiratory effort. Using CPAP.  Patient able to lie flat.  On room air. Cardiovascular: Regular rate and rhythm, no murmurs / rubs / gallops. No extremity edema. 2+ pedal pulses. No carotid bruits.  Abdomen: obese, no tenderness, no masses palpated. No hepatosplenomegaly. Bowel sounds positive.  Musculoskeletal: no clubbing / cyanosis. No joint deformity upper and lower extremities. Good ROM, no contractures. Normal muscle tone.  Skin: no rashes, lesions, ulcers. No induration Neurologic: CN 2-12 grossly  intact. Strength 5/5 in all 4.  Psychiatric: Normal judgment and insight. Alert and oriented x 3. Normal mood.   Labs on Admission: I have personally reviewed following labs and imaging studies  CBC: Recent Labs  Lab 08/19/17 2004  WBC 20.1*  HGB 14.6  HCT 41.7  MCV 88.5  PLT 161   Basic Metabolic Panel: Recent Labs  Lab 08/19/17 2004  NA 138  K 3.4*  CL 103  CO2 25  GLUCOSE 193*  BUN 18  CREATININE 0.94  CALCIUM 9.4   CBG: Recent Labs  Lab 08/19/17 2003  GLUCAP 191*   Urine analysis:    Component Value Date/Time   COLORURINE AMBER (A) 08/19/2017 1940   APPEARANCEUR CLOUDY (A) 08/19/2017 1940   LABSPEC 1.017 08/19/2017 1940   PHURINE 5.0 08/19/2017 1940   GLUCOSEU NEGATIVE 08/19/2017 1940   HGBUR NEGATIVE  08/19/2017 1940   BILIRUBINUR NEGATIVE 08/19/2017 1940   KETONESUR NEGATIVE 08/19/2017 1940   PROTEINUR NEGATIVE 08/19/2017 1940   NITRITE NEGATIVE 08/19/2017 1940   LEUKOCYTESUR LARGE (A) 08/19/2017 1940    Radiological Exams on Admission: Dg Chest Port 1 View  Result Date: 08/20/2017 CLINICAL DATA:  Fever and sepsis.  Dyspnea. EXAM: PORTABLE CHEST 1 VIEW COMPARISON:  None FINDINGS: Eventration of the right hemidiaphragm. Heart is top-normal in size. Minimal aortic atherosclerosis and mild central vascular congestion is seen. No pulmonary consolidation, effusion or pneumothorax is noted. The patient's chin obscures portions of the apices medially. No acute osseous abnormality. IMPRESSION: Borderline cardiomegaly with mild central vascular congestion. No alveolar consolidation. Electronically Signed   By: Tollie Eth M.D.   On: 08/20/2017 00:03    EKG: Independently reviewed.  Sinus rhythm.  QTc 513.  Assessment/Plan Active Problems:   Sepsis (HCC)   DM (diabetes mellitus) (HCC)   HTN (hypertension)   Sleep apnea  Sepsis-likely secondary to UTI.  Fever at 102, dysuria with frequency.  WBC 20.  Lactic acid -3.18.4L bolus given in ED. Chest x-ray-mild  central vascular congestion.  -Follow-up blood and urine cultures drawn in ED -Continue IV Vanco (considering history of staph infection) per pharm, continue IV ceftriaxone 2g daily -Trend lactic acid 3.18> 2.8 -Will give additional 500 ml bolus and hold off on subsequent IVF,  -CBC BMP a.m.  Prolonged QTC, hypokalemia-  QTc- 513 -Repeat EKG -Avoid QTC prolonging medications -Replace K -Check magnesium  DM-glucose 193. - SSI -Continue home Trulicity hold metformin  HTN-pressure systolic 130s to 161W. -Hold home HCTZ and Norvasc while hydrating and in the setting of sepsis, continue home Tamsulosin  BPH -likely complicating UTI -Continue home tamsulosin and finasteride  Sleep apnea-  - CPAP  HIV as part of routine health screening  DVT prophylaxis: Lovenox Code Status: Full Family Communication: None at beside Disposition Plan: Per rounding team Consults called: none Admission status: inpt, tele   Onnie Boer MD [facility] Pager [phone] From 6PM-2AM.  Otherwise please contact night-coverage [email or web address] Password [email or web address]  08/20/2017, 3:21 AM

## 2017-08-20 NOTE — ED Notes (Signed)
I gave critical I Stat CG4 result to MD Knapp 

## 2017-08-20 NOTE — Progress Notes (Signed)
Pt. Found on home CPAP.

## 2017-08-21 ENCOUNTER — Inpatient Hospital Stay (HOSPITAL_COMMUNITY): Payer: No Typology Code available for payment source

## 2017-08-21 DIAGNOSIS — A419 Sepsis, unspecified organism: Principal | ICD-10-CM

## 2017-08-21 DIAGNOSIS — R609 Edema, unspecified: Secondary | ICD-10-CM

## 2017-08-21 DIAGNOSIS — G4730 Sleep apnea, unspecified: Secondary | ICD-10-CM

## 2017-08-21 DIAGNOSIS — N3000 Acute cystitis without hematuria: Secondary | ICD-10-CM

## 2017-08-21 LAB — GLUCOSE, CAPILLARY
GLUCOSE-CAPILLARY: 133 mg/dL — AB (ref 65–99)
GLUCOSE-CAPILLARY: 122 mg/dL — AB (ref 65–99)
Glucose-Capillary: 173 mg/dL — ABNORMAL HIGH (ref 65–99)
Glucose-Capillary: 145 mg/dL — ABNORMAL HIGH (ref 65–99)

## 2017-08-21 LAB — CBC
HCT: 34.3 % — ABNORMAL LOW (ref 39.0–52.0)
Hemoglobin: 11.7 g/dL — ABNORMAL LOW (ref 13.0–17.0)
MCH: 30.3 pg (ref 26.0–34.0)
MCHC: 34.1 g/dL (ref 30.0–36.0)
MCV: 88.9 fL (ref 78.0–100.0)
PLATELETS: 129 10*3/uL — AB (ref 150–400)
RBC: 3.86 MIL/uL — ABNORMAL LOW (ref 4.22–5.81)
RDW: 13.6 % (ref 11.5–15.5)
WBC: 10.3 10*3/uL (ref 4.0–10.5)

## 2017-08-21 LAB — BASIC METABOLIC PANEL
Anion gap: 10 (ref 5–15)
BUN: 13 mg/dL (ref 6–20)
CALCIUM: 8 mg/dL — AB (ref 8.9–10.3)
CO2: 22 mmol/L (ref 22–32)
CREATININE: 0.71 mg/dL (ref 0.61–1.24)
Chloride: 106 mmol/L (ref 101–111)
GFR calc non Af Amer: >60 mL/min (ref >60)
Glucose, Bld: 150 mg/dL — ABNORMAL HIGH (ref 65–99)
Potassium: 3.5 mmol/L (ref 3.5–5.1)
SODIUM: 138 mmol/L (ref 135–145)

## 2017-08-21 LAB — LACTIC ACID, PLASMA
LACTIC ACID, VENOUS: 0.8 mmol/L (ref 0.5–1.9)
LACTIC ACID, VENOUS: 0.9 mmol/L (ref 0.5–1.9)

## 2017-08-21 LAB — URINE CULTURE

## 2017-08-21 LAB — PROCALCITONIN: Procalcitonin: 0.3 ng/mL

## 2017-08-21 NOTE — Progress Notes (Signed)
LE venous duplex prelim: negative for DVT in visualized veins.  Isley Weisheit Eunice, RDMS, RVT  

## 2017-08-21 NOTE — Progress Notes (Signed)
PROGRESS NOTE  Carlos Craig ZOX:[id number] DOB: 09/02/1955 DOA: 08/19/2017 PCP: System, Pcp Not In  HPI/Recap of past 24 hours: [name] is a 62 year old morbidly obese male with past medical history significant for type 2 diabetes, hypertension, BPH, who presented to the ED with complaints of fever chills dysuria and worsening urinary incontinence of 1 day duration.  Patient resides in [place] and and is visiting here.  Admitted for sepsis secondary to UTI.  On examination this morning patient appears to have right lower extremity cellulitis.  Currently on IV vancomycin and IV ceftriaxone, will continue.  MRSA screening ordered.  Renal ultrasound unremarkable for hydronephrosis.  Personally reviewed EKG which revealed QTC 513.  Repeated twelve-lead EKG this morning.  No prior records of 2D echo.  Ordered BNP.  Self-reported having a stress test done 2 years ago which was unremarkable.  08/21/17: seen and examined at his bedside. Reports pain in his lower extremities bilaterally. B/L LE duplex U/S negative. RLE cellulitis present on admission.   Assessment/Plan: Active Problems:   Sepsis (HCC)   DM (diabetes mellitus) (HCC)   HTN (hypertension)   Sleep apnea  Sepsis 2/2 to UTI vs RLE cellulitis, poa C/w IV ceftriaxone Urine cx suggest recollection Repeat urine cx Blood cx x 2 NGTD Monitor fever curve Repeat CBC in the am  RLE cellulitis On IV vancomycin MRSA screening negative Switch to keflex in the am  Prolonged QTC Avoid QT prolonging agents  Type 2 diabetes C/w home regimen  HTN BP is stable C/w home meds  BPH C/w home meds Monitor urine output  Morbid obesity BMI 46  Weight loss outpatient  OSA C/w CPAP qhs   Code Status: full  Family Communication: none at bedside  Disposition Plan: home possibly tomorrow   Consultants:  none  Procedures:  none  Antimicrobials:  IV vancomycin  IV ceftriaxone  DVT prophylaxis:  sq lovenox  daily   Objective: Vitals:   08/20/17 1708 08/20/17 2108 08/21/17 1409 08/21/17 2018  BP: 129/69 129/70 129/72 (!) 142/72  Pulse: 77 75 66 70  Resp: 18 20 18 20   Temp: 98.2 F (36.8 C) 98.1 F (36.7 C) 98.5 F (36.9 C) 98.3 F (36.8 C)  TempSrc: Oral Oral Oral Oral  SpO2: 97% 94% 96% 96%  Weight:      Height:        Intake/Output Summary (Last 24 hours) at 08/21/2017 2128 Last data filed at 08/20/2017 2234 Gross per 24 hour  Intake -  Output 300 ml  Net -300 ml   Filed Weights   08/20/17 0014  Weight: (!) 154.2 kg (340 lb)    Exam:  . General: 62 y.o. year-old male well developed well nourished in no acute distress.  Alert and oriented x3. . Cardiovascular: Regular rate and rhythm with no rubs or gallops.  No thyromegaly or JVD noted.   Marland Kitchen. Respiratory: Clear to auscultation with no wheezes or rales. Good inspiratory effort. . Abdomen: Soft nontender nondistended with normal bowel sounds x4 quadrants. . Musculoskeletal: No lower extremity edema. 2/4 pulses in all 4 extremities. . Skin: RLE erythema, edema, tender on palpation, and warmth, improving. Marland Kitchen. Psychiatry: Mood is appropriate for condition and setting   Data Reviewed: CBC: Recent Labs  Lab 08/19/17 2004 08/20/17 0038 08/21/17 0813  WBC 20.1* 21.3* 10.3  HGB 14.6 14.0 11.7*  HCT 41.7 41.2 34.3*  MCV 88.5 90.5 88.9  PLT 161 164 129*   Basic Metabolic Panel: Recent Labs  Lab 08/19/17 2004  08/20/17 0038 08/21/17 0813  NA 138 140 138  K 3.4* 3.6 3.5  CL 103 101 106  CO2 25 27 22   GLUCOSE 193* 200* 150*  BUN 18 18 13   CREATININE 0.94 1.08 0.71  CALCIUM 9.4 9.5 8.0*  MG  --  1.7  --    GFR: Estimated Creatinine Clearance: 144.6 mL/min (by C-G formula based on SCr of 0.71 mg/dL). Liver Function Tests: No results for input(s): AST, ALT, ALKPHOS, BILITOT, PROT, ALBUMIN in the last 168 hours. No results for input(s): LIPASE, AMYLASE in the last 168 hours. No results for input(s): AMMONIA in the  last 168 hours. Coagulation Profile: No results for input(s): INR, PROTIME in the last 168 hours. Cardiac Enzymes: No results for input(s): CKTOTAL, CKMB, CKMBINDEX, TROPONINI in the last 168 hours. BNP (last 3 results) No results for input(s): PROBNP in the last 8760 hours. HbA1C: No results for input(s): HGBA1C in the last 72 hours. CBG: Recent Labs  Lab 08/20/17 1829 08/20/17 2103 08/21/17 0734 08/21/17 1125 08/21/17 1642  GLUCAP 149* 178* 133* 173* 145*   Lipid Profile: No results for input(s): CHOL, HDL, LDLCALC, TRIG, CHOLHDL, LDLDIRECT in the last 72 hours. Thyroid Function Tests: No results for input(s): TSH, T4TOTAL, FREET4, T3FREE, THYROIDAB in the last 72 hours. Anemia Panel: No results for input(s): VITAMINB12, FOLATE, FERRITIN, TIBC, IRON, RETICCTPCT in the last 72 hours. Urine analysis:    Component Value Date/Time   COLORURINE AMBER (A) 08/19/2017 1940   APPEARANCEUR CLOUDY (A) 08/19/2017 1940   LABSPEC 1.017 08/19/2017 1940   PHURINE 5.0 08/19/2017 1940   GLUCOSEU NEGATIVE 08/19/2017 1940   HGBUR NEGATIVE 08/19/2017 1940   BILIRUBINUR NEGATIVE 08/19/2017 1940   KETONESUR NEGATIVE 08/19/2017 1940   PROTEINUR NEGATIVE 08/19/2017 1940   NITRITE NEGATIVE 08/19/2017 1940   LEUKOCYTESUR LARGE (A) 08/19/2017 1940   Sepsis Labs: @LABRCNTIP (procalcitonin:4,lacticidven:4)  ) Recent Results (from the past 240 hour(s))  Urine culture     Status: Abnormal   Collection Time: 08/19/17  7:47 PM  Result Value Ref Range Status   Specimen Description   Final    URINE, CLEAN CATCH Performed at [facility], [address], [place], [place] [address]    Special Requests   Final    NONE Performed at [facility], [address], [place], [place] [address]    Culture MULTIPLE SPECIES PRESENT, SUGGEST RECOLLECTION (A)  Final   Report Status 08/21/2017 FINAL  Final  Blood Culture (routine x 2)     Status: None (Preliminary result)    Collection Time: 08/20/17 12:00 AM  Result Value Ref Range Status   Specimen Description   Final    BLOOD RIGHT HAND Performed at [facility], [address] [place], [place] [address]    Special Requests   Final    Blood Culture results may not be optimal due to an inadequate volume of blood received in culture bottles BOTTLES DRAWN AEROBIC AND ANAEROBIC   Culture   Final    NO GROWTH 1 DAY Performed at [facility], [address] [place], [place] [address]    Report Status PENDING  Incomplete  Blood Culture (routine x 2)     Status: None (Preliminary result)   Collection Time: 08/20/17 12:00 AM  Result Value Ref Range Status   Specimen Description   Final    BLOOD RIGHT ARM Performed at [facility], [address], [place], [place] [address]  Special Requests   Final    BOTTLES DRAWN AEROBIC ONLY Blood Culture adequate volume   Culture   Final    NO GROWTH 1 DAY Performed at [facility], [address], [place], [place] [address]    Report Status PENDING  Incomplete  MRSA PCR Screening     Status: None   Collection Time: 08/20/17  5:13 PM  Result Value Ref Range Status   MRSA by PCR NEGATIVE NEGATIVE Final    Comment:        The GeneXpert MRSA Assay (FDA approved for NASAL specimens only), is one component of a comprehensive MRSA colonization surveillance program. It is not intended to diagnose MRSA infection nor to guide or monitor treatment for MRSA infections. Performed at [facility], [address] [place], [place] [address]       Studies: No results found.  Scheduled Meds: . [START ON 08/24/2017] Dulaglutide  1.5 mg Subcutaneous Weekly  . enoxaparin (LOVENOX) injection  0.5 mg/kg Subcutaneous Q24H  . finasteride  5 mg Oral QHS  . insulin aspart  0-9 Units Subcutaneous TID WC  . lisinopril  40 mg Oral QHS  . rosuvastatin  20 mg Oral q1800  . tamsulosin  0.4 mg Oral QHS  .  venlafaxine XR  150 mg Oral Q breakfast   And  . venlafaxine XR  75 mg Oral Q supper    Continuous Infusions: . cefTRIAXone (ROCEPHIN)  IV Stopped (08/21/17 0021)  . vancomycin 1,250 mg (08/21/17 1718)     LOS: 1 day     Darlin Drop, MD [facility] Pager [phone]  If 7PM-7AM, please contact night-coverage [email or web address] Password [facility] 08/21/2017, 9:28 PM

## 2017-08-22 DIAGNOSIS — I10 Essential (primary) hypertension: Secondary | ICD-10-CM

## 2017-08-22 LAB — MAGNESIUM: MAGNESIUM: 2 mg/dL (ref 1.7–2.4)

## 2017-08-22 LAB — CBC
HCT: 33.9 % — ABNORMAL LOW (ref 39.0–52.0)
Hemoglobin: 11.5 g/dL — ABNORMAL LOW (ref 13.0–17.0)
MCH: 29.9 pg (ref 26.0–34.0)
MCHC: 33.9 g/dL (ref 30.0–36.0)
MCV: 88.3 fL (ref 78.0–100.0)
PLATELETS: 138 10*3/uL — AB (ref 150–400)
RBC: 3.84 MIL/uL — AB (ref 4.22–5.81)
RDW: 13.4 % (ref 11.5–15.5)
WBC: 6.8 10*3/uL (ref 4.0–10.5)

## 2017-08-22 LAB — BASIC METABOLIC PANEL
ANION GAP: 6 (ref 5–15)
BUN: 13 mg/dL (ref 6–20)
CO2: 24 mmol/L (ref 22–32)
Calcium: 8.2 mg/dL — ABNORMAL LOW (ref 8.9–10.3)
Chloride: 110 mmol/L (ref 101–111)
Creatinine, Ser: 0.74 mg/dL (ref 0.61–1.24)
Glucose, Bld: 145 mg/dL — ABNORMAL HIGH (ref 65–99)
POTASSIUM: 3.4 mmol/L — AB (ref 3.5–5.1)
SODIUM: 140 mmol/L (ref 135–145)

## 2017-08-22 LAB — GLUCOSE, CAPILLARY
Glucose-Capillary: 123 mg/dL — ABNORMAL HIGH (ref 65–99)
Glucose-Capillary: 183 mg/dL — ABNORMAL HIGH (ref 65–99)

## 2017-08-22 MED ORDER — CEPHALEXIN 500 MG PO CAPS: 500.0000 mg | ORAL_CAPSULE | Freq: Four times a day (QID) | ORAL | 0 refills | Status: AC

## 2017-08-22 MED ORDER — CEPHALEXIN 500 MG PO CAPS
500.0000 mg | ORAL_CAPSULE | Freq: Four times a day (QID) | ORAL | Status: DC
  Administered 2017-08-22: 500 mg via ORAL
  Filled 2017-08-22: qty 1

## 2017-08-22 MED ORDER — POTASSIUM CHLORIDE CRYS ER 20 MEQ PO TBCR
40.0000 meq | EXTENDED_RELEASE_TABLET | Freq: Once | ORAL | Status: AC
Start: 2017-08-22 — End: 2017-08-22
  Administered 2017-08-22: 40 meq via ORAL
  Filled 2017-08-22: qty 2

## 2017-08-22 NOTE — Discharge Summary (Signed)
Discharge Summary  Carlos Craig ZOX:[id number] DOB: 02-13-1956  PCP: System, Pcp Not In  Admit date: 08/19/2017 Discharge date: 08/22/2017  Time spent: 25 minutes  Recommendations for Outpatient Follow-up:  1. Follow-up with PCP 2. Take your medications as prescribed  Discharge Diagnoses:  Active Hospital Problems   Diagnosis Date Noted  . Sepsis (HCC) 08/20/2017  . DM (diabetes mellitus) (HCC) 08/20/2017  . HTN (hypertension) 08/20/2017  . Sleep apnea 08/20/2017    Resolved Hospital Problems  No resolved problems to display.    Discharge Condition: Stable  Diet recommendation: Heart healthy diabetic diet  Vitals:   08/22/17 0444 08/22/17 1313  BP: 98/60 (!) 103/52  Pulse: (!) 57 60  Resp:  19  Temp:  97.8 F (36.6 C)  SpO2:  96%    History of present illness:  Mr.Kolbis a 62 year old morbidly obese male with past medical history significant for type 2 diabetes, hypertension, BPH, who presented to the ED with complaints of fever chills dysuria and worsening urinary incontinence of 1 day duration.Patient resides in [place] and and is visiting here. Admitted for sepsis secondary to UTI. On examination this morning patient appears to have right lower extremity cellulitis. Currently on IV vancomycin and IV ceftriaxone, will continue. MRSA screening ordered. Renal ultrasound unremarkable for hydronephrosis.  Personally reviewed EKG which revealed QTC 513. Repeated twelve-lead EKG this morning. No prior records of 2D echo. Ordered BNP. Self-reported having a stress test done 2 years ago which was unremarkable.  08/21/17: seen and examined at his bedside. Reports pain in his lower extremities bilaterally. B/L LE duplex U/S negative. RLE cellulitis present on admission.  08/22/2017: Patient seen and examined at his bedside.  He states he feels much better.  Afebrile with no leukocytosis.  On the day of discharge patient was hemodynamically stable.  He will need to  follow-up with his PCP post hospitalization.  Hospital Course:  Active Problems:   Sepsis (HCC)   DM (diabetes mellitus) (HCC)   HTN (hypertension)   Sleep apnea  Sepsis 2/2 to UTI vs RLE cellulitis, poa Completed 3 days of IV ceftriaxone and IV vancomycin Started Keflex 500 mg 4 times daily x10 days Urine culture multiple species present Blood cultures x2- no growth in 1 day  RLE cellulitis Management as stated above Follow-up with primary care provider post hospitalization  Hypokalemia Potassium 3.4 Repleted with 40 mEq of potassium chloride p.o. Follow-up with PCP  Prolonged QTC Avoid QT prolonging agents  Type 2 diabetes C/w home antidiabetic regimen  HTN BP is stable C/w home meds  BPH C/w home meds Monitor urine output  Morbid obesity BMI 46  Continue weight loss outpatient Self-reported intentionally lost 30 pounds in the last 2 months  OSA C/w CPAP qhs     Procedures:  None  Consultations:  None  Discharge Exam: BP (!) 103/52 (BP Location: Left Arm)   Pulse 60   Temp 97.8 F (36.6 C) (Oral)   Resp 19   Ht 6' (1.829 m)   Wt (!) 154.2 kg (340 lb)   SpO2 96%   BMI 46.11 kg/m  . General: 62 y.o. year-old male well developed well nourished in no acute distress.  Alert and oriented x3. . Cardiovascular: Regular rate and rhythm with no rubs or gallops.  No thyromegaly or JVD noted.   Marland Kitchen Respiratory: Clear to auscultation with no wheezes or rales. Good inspiratory effort. . Abdomen: Soft nontender nondistended with normal bowel sounds x4 quadrants. . Musculoskeletal: No lower extremity edema.  2/4 pulses in all 4 extremities. . Skin: Right lower extremity erythema, edema, tenderness on palpation which are improving.   Marland Kitchen. Psychiatry: Mood is appropriate for condition and setting  Discharge Instructions You were cared for by a hospitalist during your hospital stay. If you have any questions about your discharge medications or the care  you received while you were in the hospital after you are discharged, you can call the unit and asked to speak with the hospitalist on call if the hospitalist that took care of you is not available. Once you are discharged, your primary care physician will handle any further medical issues. Please note that NO REFILLS for any discharge medications will be authorized once you are discharged, as it is imperative that you return to your primary care physician (or establish a relationship with a primary care physician if you do not have one) for your aftercare needs so that they can reassess your need for medications and monitor your lab values.   Allergies as of 08/22/2017      Reactions   Bupropion Hives   Canagliflozin    Other reaction(s): Other; see comment Yeast infections      Medication List    STOP taking these medications   BETA-SITOSTEROL PLANT STEROLS PO     TAKE these medications   Acidophilus Caps capsule Take 1 capsule by mouth daily.   amLODipine 10 MG tablet Commonly known as:  NORVASC Take 10 mg by mouth at bedtime.   b complex vitamins tablet Take 1 tablet by mouth daily.   CALCIUM PO Take 1 tablet by mouth daily.   cephALEXin 500 MG capsule Commonly known as:  KEFLEX Take 1 capsule (500 mg total) by mouth 4 (four) times daily for 10 days.   cholecalciferol 1000 units tablet Commonly known as:  VITAMIN D Take 5,000 Units by mouth daily.   CoQ-10 400 MG Caps Take 400 mg by mouth daily.   ECOTRIN LOW STRENGTH 81 MG EC tablet Generic drug:  aspirin Take 81 mg by mouth at bedtime.   finasteride 5 MG tablet Commonly known as:  PROSCAR Take 5 mg by mouth at bedtime.   Fish Oil 1000 MG Caps Take 2 capsules by mouth daily.   glucosamine-chondroitin 500-400 MG tablet Take 1 tablet by mouth daily.   hydrochlorothiazide 25 MG tablet Commonly known as:  HYDRODIURIL Take 25 mg by mouth daily.   lisinopril 40 MG tablet Commonly known as:   PRINIVIL,ZESTRIL Take 40 mg by mouth at bedtime.   LUTEIN PO Take 1 capsule by mouth daily.   MAGNESIUM-POTASSIUM PO Take 3 capsules by mouth daily.   metFORMIN 500 MG 24 hr tablet Commonly known as:  GLUCOPHAGE-XR Take 2,000 mg by mouth every morning.   psyllium 0.52 g capsule Commonly known as:  REGULOID Take 2.6 g by mouth daily.   rosuvastatin 20 MG tablet Commonly known as:  CRESTOR Take 20 mg by mouth daily.   tamsulosin 0.4 MG Caps capsule Commonly known as:  FLOMAX Take 0.4 capsules by mouth at bedtime.   TRULICITY 1.5 MG/0.5ML Sopn Generic drug:  Dulaglutide Inject 1.5 mg into the skin once a week.   venlafaxine XR 75 MG 24 hr capsule Commonly known as:  EFFEXOR-XR Take 1-3 capsules by mouth as directed. 2 capsule in the AM and 1 capsule at night   zinc gluconate 50 MG tablet Take 50 mg by mouth daily.      Allergies  Allergen Reactions  . Bupropion Hives  .  Canagliflozin     Other reaction(s): Other; see comment Yeast infections      The results of significant diagnostics from this hospitalization (including imaging, microbiology, ancillary and laboratory) are listed below for reference.    Significant Diagnostic Studies: US Renal  Result Date: 08/20/2017 CLINICAL DATA:  Urinary tract infection. EXAM: RENAL / URINARY TRACT ULTRASOUND COMPLETE COMPARISON:  None. FINDINGS: Right Kidney: Length: 13.1 cm. Parenchymal echogenicity is within normal limits. Anechoic or nearly anechoic lesions in the right kidney measure up to 2.2 x 1.8 x 2.2 cm. No hydronephrosis. Left Kidney: Length: 13.8 cm. Parenchymal echogenicity is within normal limits. Mild fullness of the renal pelvis without hydronephrosis. Anechoic lesion off the lower pole left kidney is seen with increased through transmission, measuring 6.2 x 6.2 x 6.6 cm. A hypoechoic lesion off the left kidney, at the junction of the mid and lower poles measures 1.0 x 0.7 x 1.3 cm. Bladder: Poor visualization due  to underdistention and body habitus. IMPRESSION: 1. Exam is technically difficult due to body habitus. 2. No acute findings. 3. Probable combination of simple and minimally complex cysts. Smallest lesion in the left kidney is difficult to definitively characterize. Consider follow-up ultrasound in 6 months, as clinically indicated. If a more aggressive approach is desired, MR abdomen without and with contrast could be performed. Electronically Signed   By: Leanna Battles M.D.   On: 08/20/2017 11:42   Dg Chest Port 1 View  Result Date: 08/20/2017 CLINICAL DATA:  Fever and sepsis.  Dyspnea. EXAM: PORTABLE CHEST 1 VIEW COMPARISON:  None FINDINGS: Eventration of the right hemidiaphragm. Heart is top-normal in size. Minimal aortic atherosclerosis and mild central vascular congestion is seen. No pulmonary consolidation, effusion or pneumothorax is noted. The patient's chin obscures portions of the apices medially. No acute osseous abnormality. IMPRESSION: Borderline cardiomegaly with mild central vascular congestion. No alveolar consolidation. Electronically Signed   By: Tollie Eth M.D.   On: 08/20/2017 00:03    Microbiology: Recent Results (from the past 240 hour(s))  Urine culture     Status: Abnormal   Collection Time: 08/19/17  7:47 PM  Result Value Ref Range Status   Specimen Description   Final    URINE, CLEAN CATCH Performed at [facility], [address], [place], [place] [address]    Special Requests   Final    NONE Performed at [facility], [address] [place], [place] [address]    Culture MULTIPLE SPECIES PRESENT, SUGGEST RECOLLECTION (A)  Final   Report Status 08/21/2017 FINAL  Final  Blood Culture (routine x 2)     Status: None (Preliminary result)   Collection Time: 08/20/17 12:00 AM  Result Value Ref Range Status   Specimen Description   Final    BLOOD RIGHT HAND Performed at [facility], [address],  [place], [place] [address]    Special Requests   Final    Blood Culture results may not be optimal due to an inadequate volume of blood received in culture bottles BOTTLES DRAWN AEROBIC AND ANAEROBIC   Culture   Final    NO GROWTH 1 DAY Performed at [facility], [address] [place], [place] [address]    Report Status PENDING  Incomplete  Blood Culture (routine x 2)     Status: None (Preliminary result)   Collection Time: 08/20/17 12:00 AM  Result Value Ref Range Status   Specimen Description   Final  BLOOD RIGHT ARM Performed at [facility], [address] [place], [place] [address]    Special Requests   Final    BOTTLES DRAWN AEROBIC ONLY Blood Culture adequate volume   Culture   Final    NO GROWTH 1 DAY Performed at [facility], [address] [place], [place] [address]    Report Status PENDING  Incomplete  MRSA PCR Screening     Status: None   Collection Time: 08/20/17  5:13 PM  Result Value Ref Range Status   MRSA by PCR NEGATIVE NEGATIVE Final    Comment:        The GeneXpert MRSA Assay (FDA approved for NASAL specimens only), is one component of a comprehensive MRSA colonization surveillance program. It is not intended to diagnose MRSA infection nor to guide or monitor treatment for MRSA infections. Performed at [facility], [address] [place], [place] [address]      Labs: Basic Metabolic Panel: Recent Labs  Lab 08/19/17 2004 08/20/17 0038 08/21/17 0813 08/22/17 0420  NA 138 140 138 140  K 3.4* 3.6 3.5 3.4*  CL 103 101 106 110  CO2 25 27 22 24   GLUCOSE 193* 200* 150* 145*  BUN 18 18 13 13   CREATININE 0.94 1.08 0.71 0.74  CALCIUM 9.4 9.5 8.0* 8.2*  MG  --  1.7  --  2.0   Liver Function Tests: No results for input(s): AST, ALT, ALKPHOS, BILITOT, PROT, ALBUMIN in the last 168 hours. No results for input(s): LIPASE, AMYLASE in the last 168 hours. No results for input(s): AMMONIA in the last  168 hours. CBC: Recent Labs  Lab 08/19/17 2004 08/20/17 0038 08/21/17 0813 08/22/17 0420  WBC 20.1* 21.3* 10.3 6.8  HGB 14.6 14.0 11.7* 11.5*  HCT 41.7 41.2 34.3* 33.9*  MCV 88.5 90.5 88.9 88.3  PLT 161 164 129* 138*   Cardiac Enzymes: No results for input(s): CKTOTAL, CKMB, CKMBINDEX, TROPONINI in the last 168 hours. BNP: BNP (last 3 results) Recent Labs    08/20/17 1719  BNP 178.4*    ProBNP (last 3 results) No results for input(s): PROBNP in the last 8760 hours.  CBG: Recent Labs  Lab 08/21/17 1125 08/21/17 1642 08/21/17 2321 08/22/17 0812 08/22/17 1154  GLUCAP 173* 145* 122* 123* 183*       Signed:  Darlin Drop, MD [facility] 08/22/2017, 1:15 PM

## 2017-08-22 NOTE — Discharge Instructions (Signed)
Urinary Tract Infection, Adult °A urinary tract infection (UTI) is an infection of any part of the urinary tract. The urinary tract includes the: °· Kidneys. °· Ureters. °· Bladder. °· Urethra. ° °These organs make, store, and get rid of pee (urine) in the body. °Follow these instructions at home: °· Take over-the-counter and prescription medicines only as told by your doctor. °· If you were prescribed an antibiotic medicine, take it as told by your doctor. Do not stop taking the antibiotic even if you start to feel better. °· Avoid the following drinks: °? Alcohol. °? Caffeine. °? Tea. °? Carbonated drinks. °· Drink enough fluid to keep your pee clear or pale yellow. °· Keep all follow-up visits as told by your doctor. This is important. °· Make sure to: °? Empty your bladder often and completely. Do not to hold pee for long periods of time. °? Empty your bladder before and after sex. °? Wipe from front to back after a bowel movement if you are male. Use each tissue one time when you wipe. °Contact a doctor if: °· You have back pain. °· You have a fever. °· You feel sick to your stomach (nauseous). °· You throw up (vomit). °· Your symptoms do not get better after 3 days. °· Your symptoms go away and then come back. °Get help right away if: °· You have very bad back pain. °· You have very bad lower belly (abdominal) pain. °· You are throwing up and cannot keep down any medicines or water. °This information is not intended to replace advice given to you by your health care provider. Make sure you discuss any questions you have with your health care provider. °Document Released: 08/14/2007 Document Revised: 08/03/2015 Document Reviewed: 01/16/2015 °Elsevier Interactive Patient Education © 2018 [employer] ° °

## 2017-08-22 NOTE — Progress Notes (Signed)
Reviewed discharge information with patient and caregiver. Answered all questions. Patient/caregiver able to teach back medications and reasons to contact MD/911. Patient verbalizes importance of PCP follow up appointment.  Dellamae Rosamilia M. Raegen Tarpley, RN  

## 2017-08-25 LAB — CULTURE, BLOOD (ROUTINE X 2)
CULTURE: NO GROWTH
Culture: NO GROWTH
SPECIAL REQUESTS: ADEQUATE

## 2019-05-22 IMAGING — US US RENAL
1 series · 14 of 25 positions shown · non-contrast
Comparison: None.

CLINICAL DATA: Urinary tract infection.

EXAM:
RENAL / URINARY TRACT ULTRASOUND COMPLETE

[Series 1: us renal · 0.26mm/px · 14 of 77 slices shown]
[im 1/77]
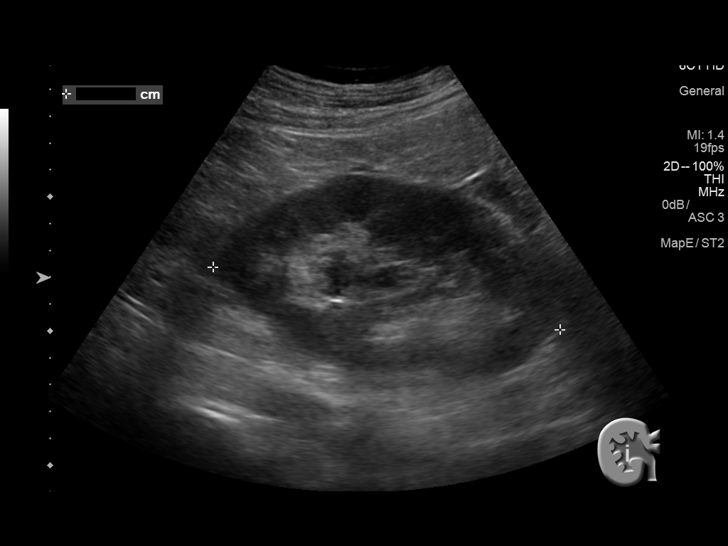
[im 7/77]
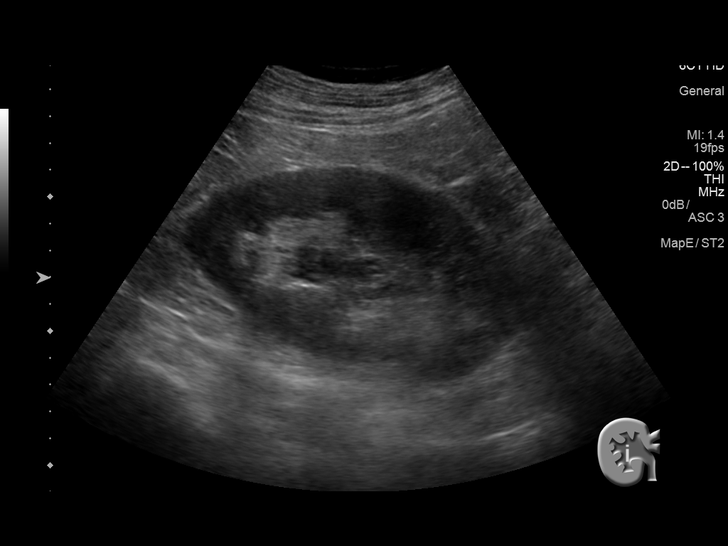
[im 13/77]
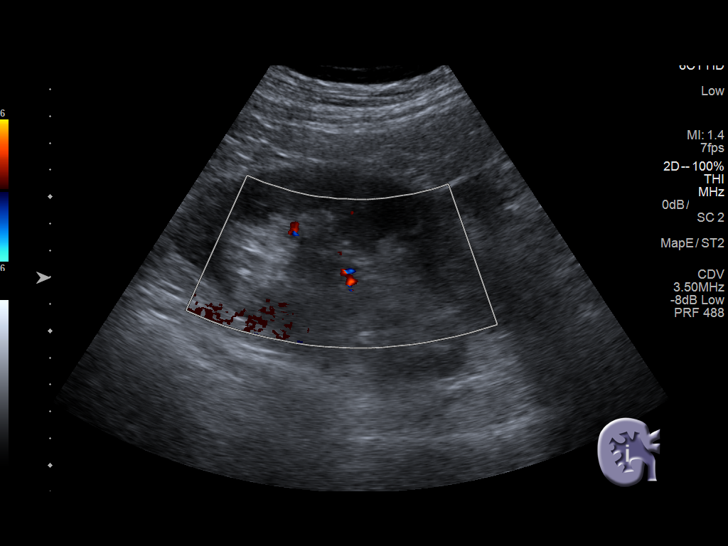
[im 20/77]
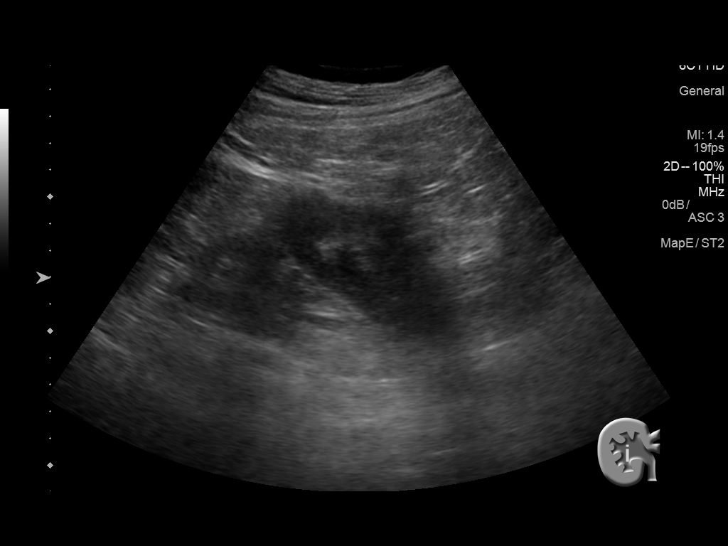
[im 26/77]
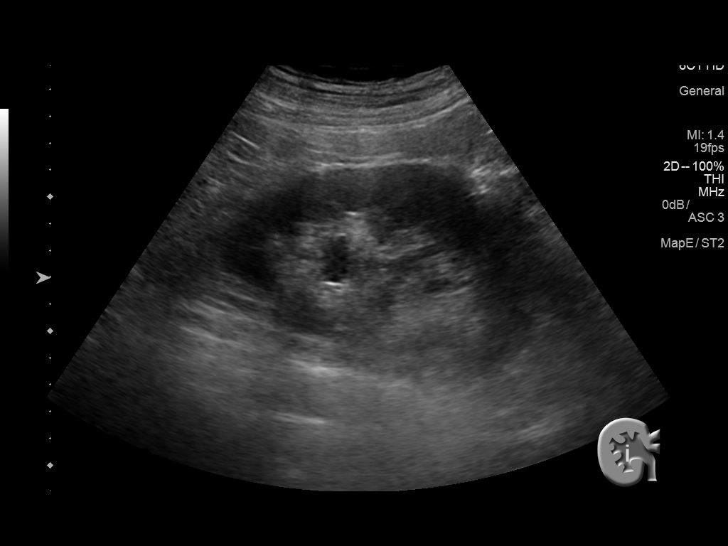
[im 29/77]
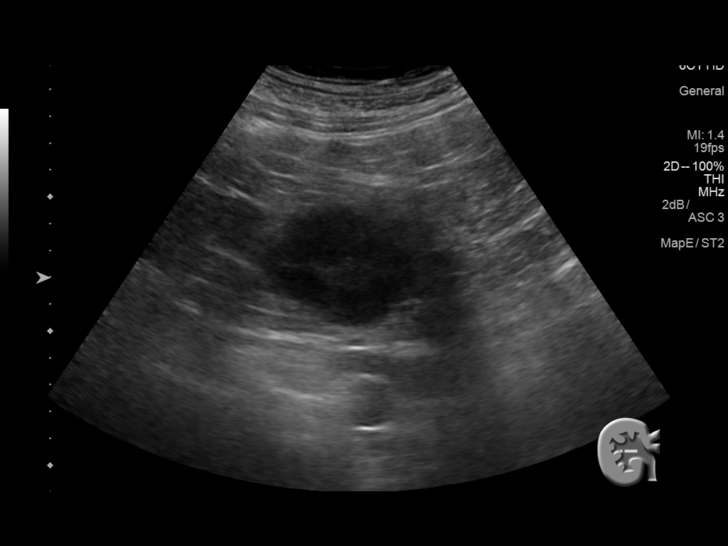
[im 35/77]
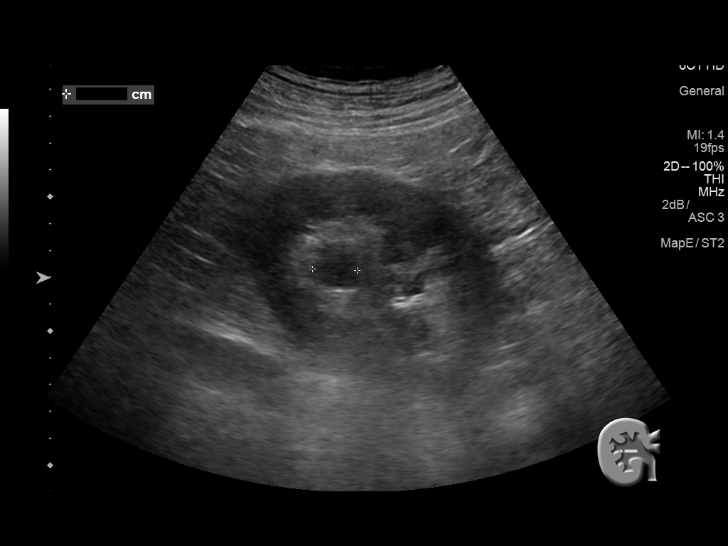
[im 42/77]
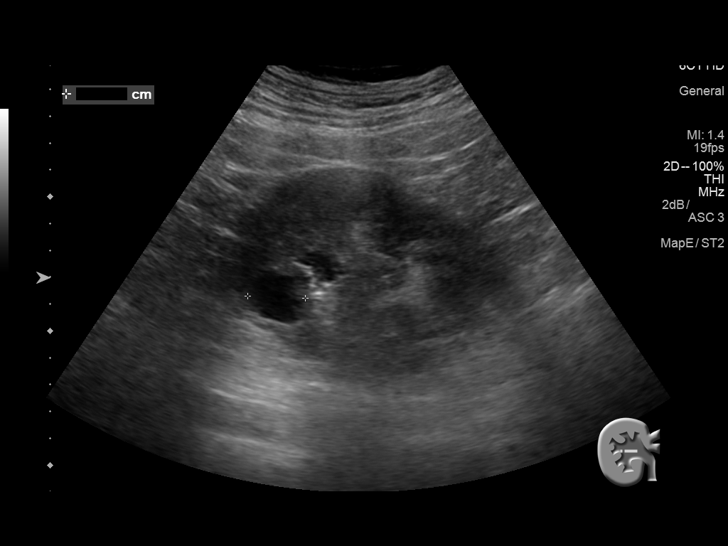
[im 48/77]
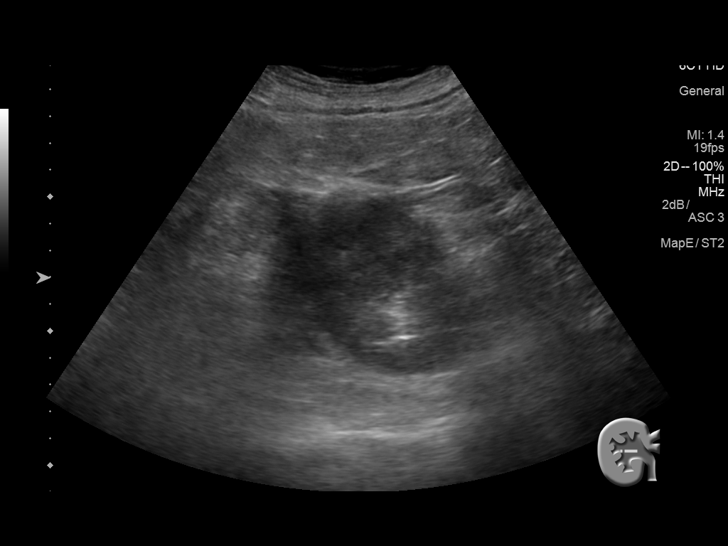
[im 51/77]
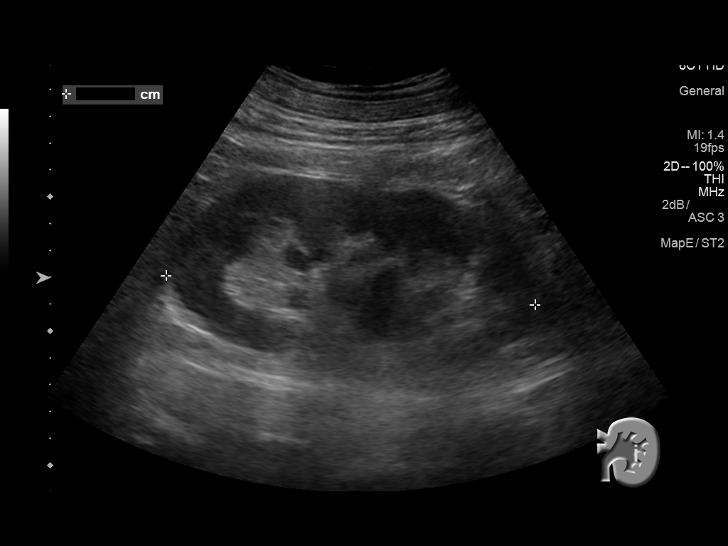
[im 58/77]
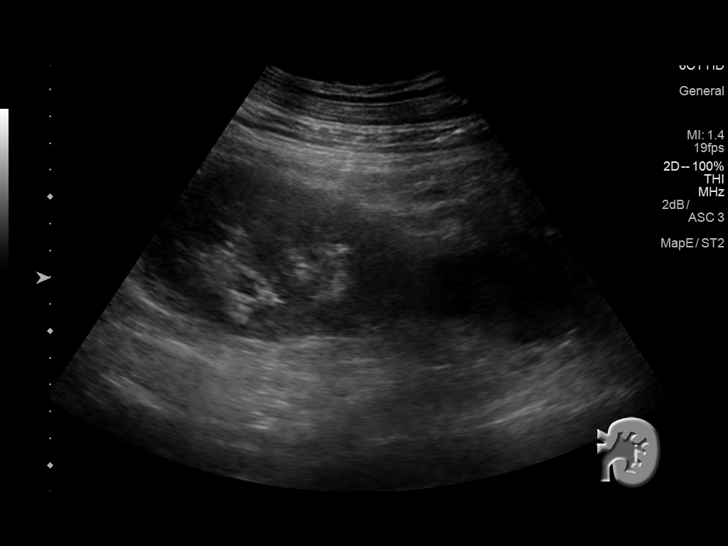
[im 64/77]
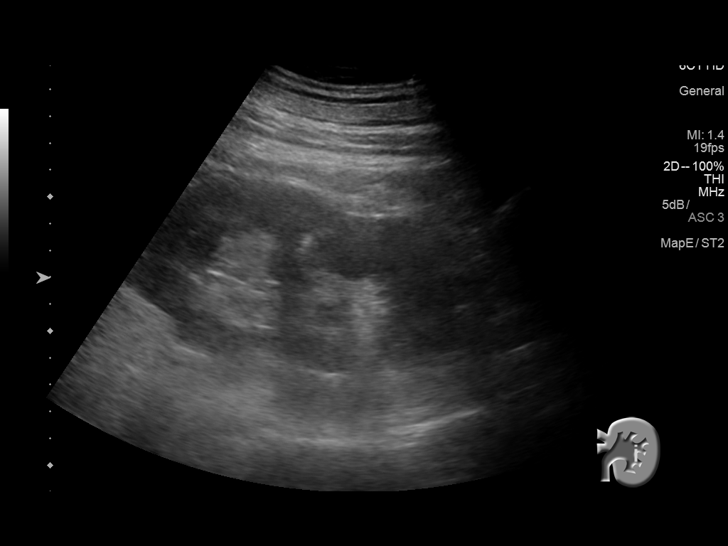
[im 70/77]
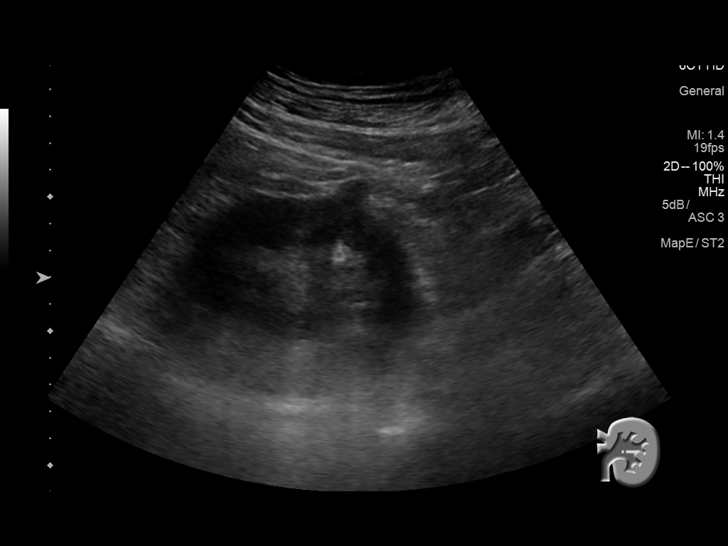
[im 77/77]
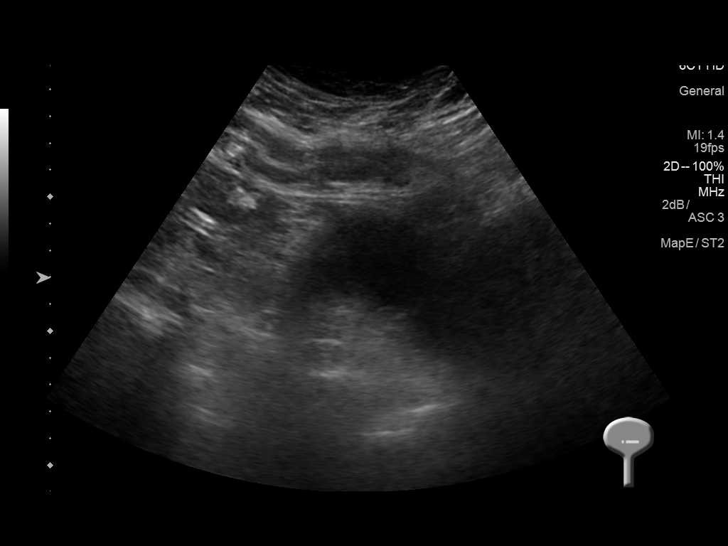

[14 of 25 positions shown; findings below may reference images not displayed]

FINDINGS: Right Kidney:

Length: 13.1 cm. Parenchymal echogenicity is within normal limits.
Anechoic or nearly anechoic lesions in the right kidney measure up
to 2.2 x 1.8 x 2.2 cm. No hydronephrosis.

Left Kidney:

Length: 13.8 cm. Parenchymal echogenicity is within normal limits.
Mild fullness of the renal pelvis without hydronephrosis. Anechoic
lesion off the lower pole left kidney is seen with increased through
transmission, measuring 6.2 x 6.2 x 6.6 cm. A hypoechoic lesion off
the left kidney, at the junction of the mid and lower poles measures
1.0 x 0.7 x 1.3 cm.

Bladder:

Poor visualization due to underdistention and body habitus.
IMPRESSION: 1. Exam is technically difficult due to body habitus.
2. No acute findings.
3. Probable combination of simple and minimally complex cysts.
Smallest lesion in the left kidney is difficult to definitively
characterize. Consider follow-up ultrasound in 6 months, as
clinically indicated. If a more aggressive approach is desired, MR
abdomen without and with contrast could be performed.

## 2019-08-22 IMAGING — DX DG CHEST 1V PORT
1 series · 2 of 2 positions shown · non-contrast
Comparison: None

CLINICAL DATA: Fever and sepsis.  Dyspnea.

EXAM:
PORTABLE CHEST 1 VIEW

[Series 1: chest ap · 0.14mm/px · 2 of 2 slices shown]
[im 1/2]
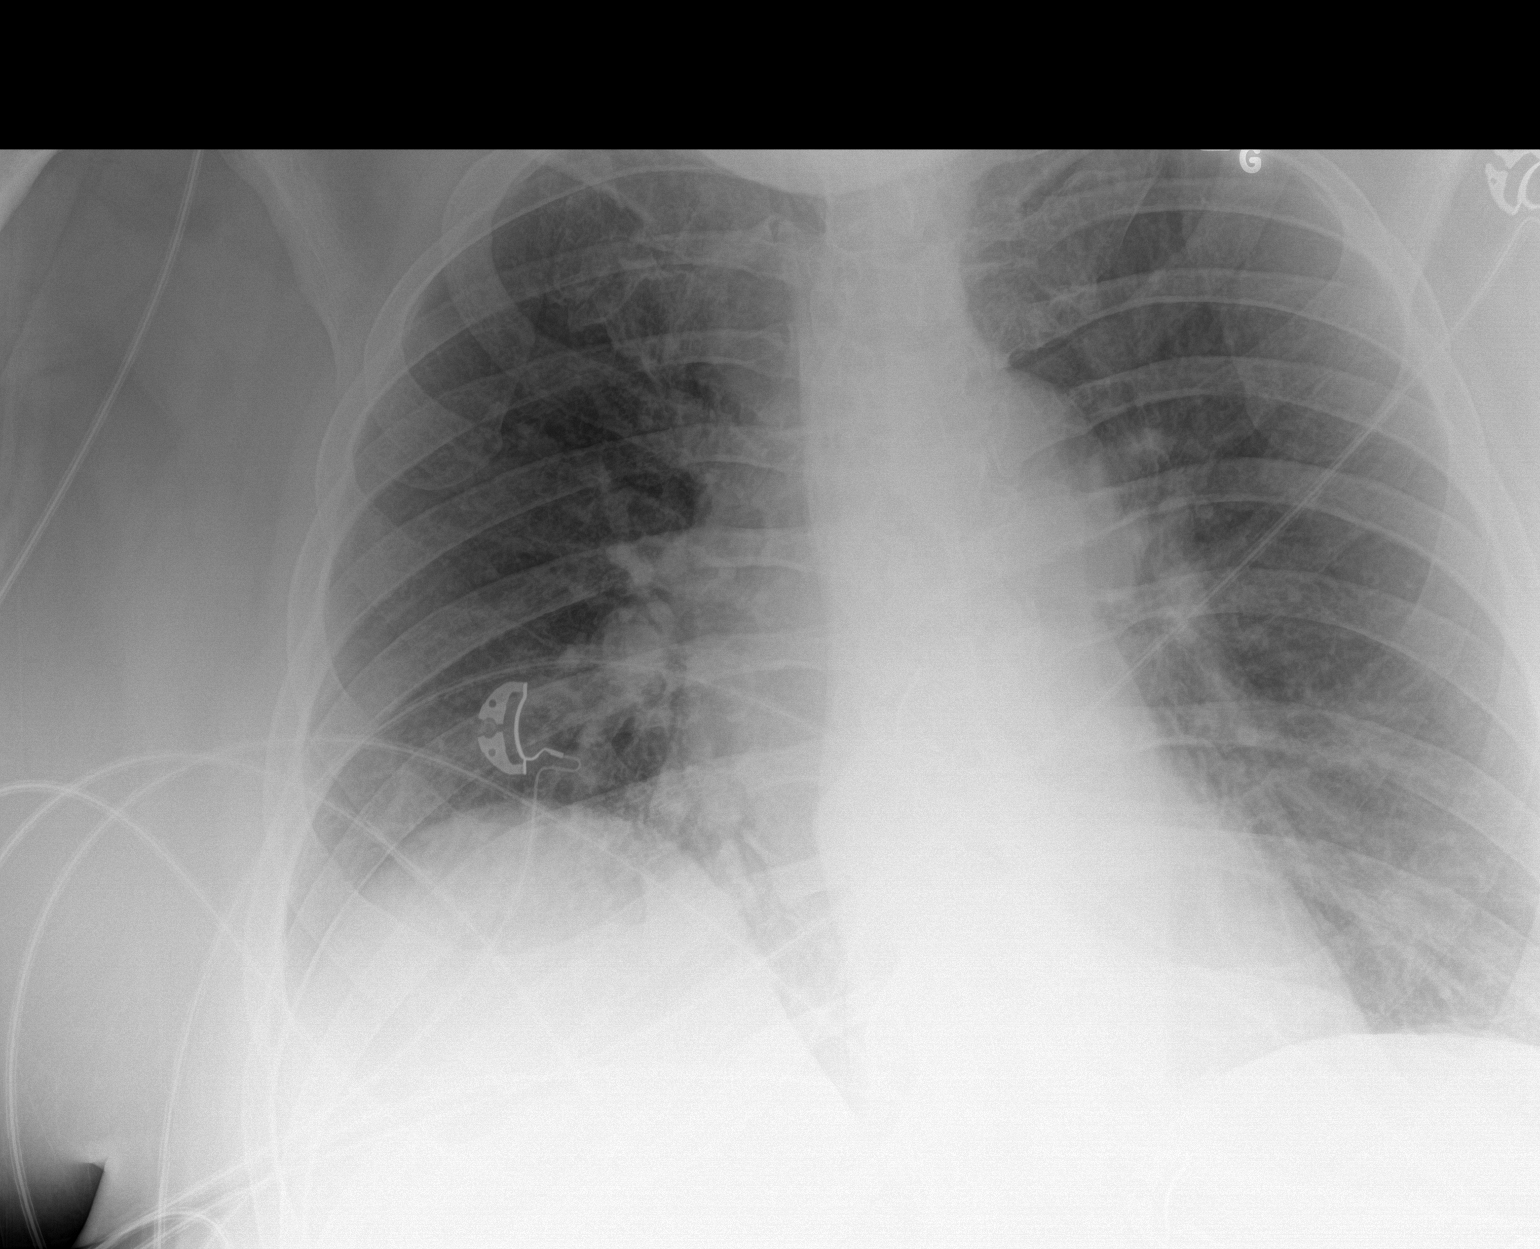
[im 2/2]
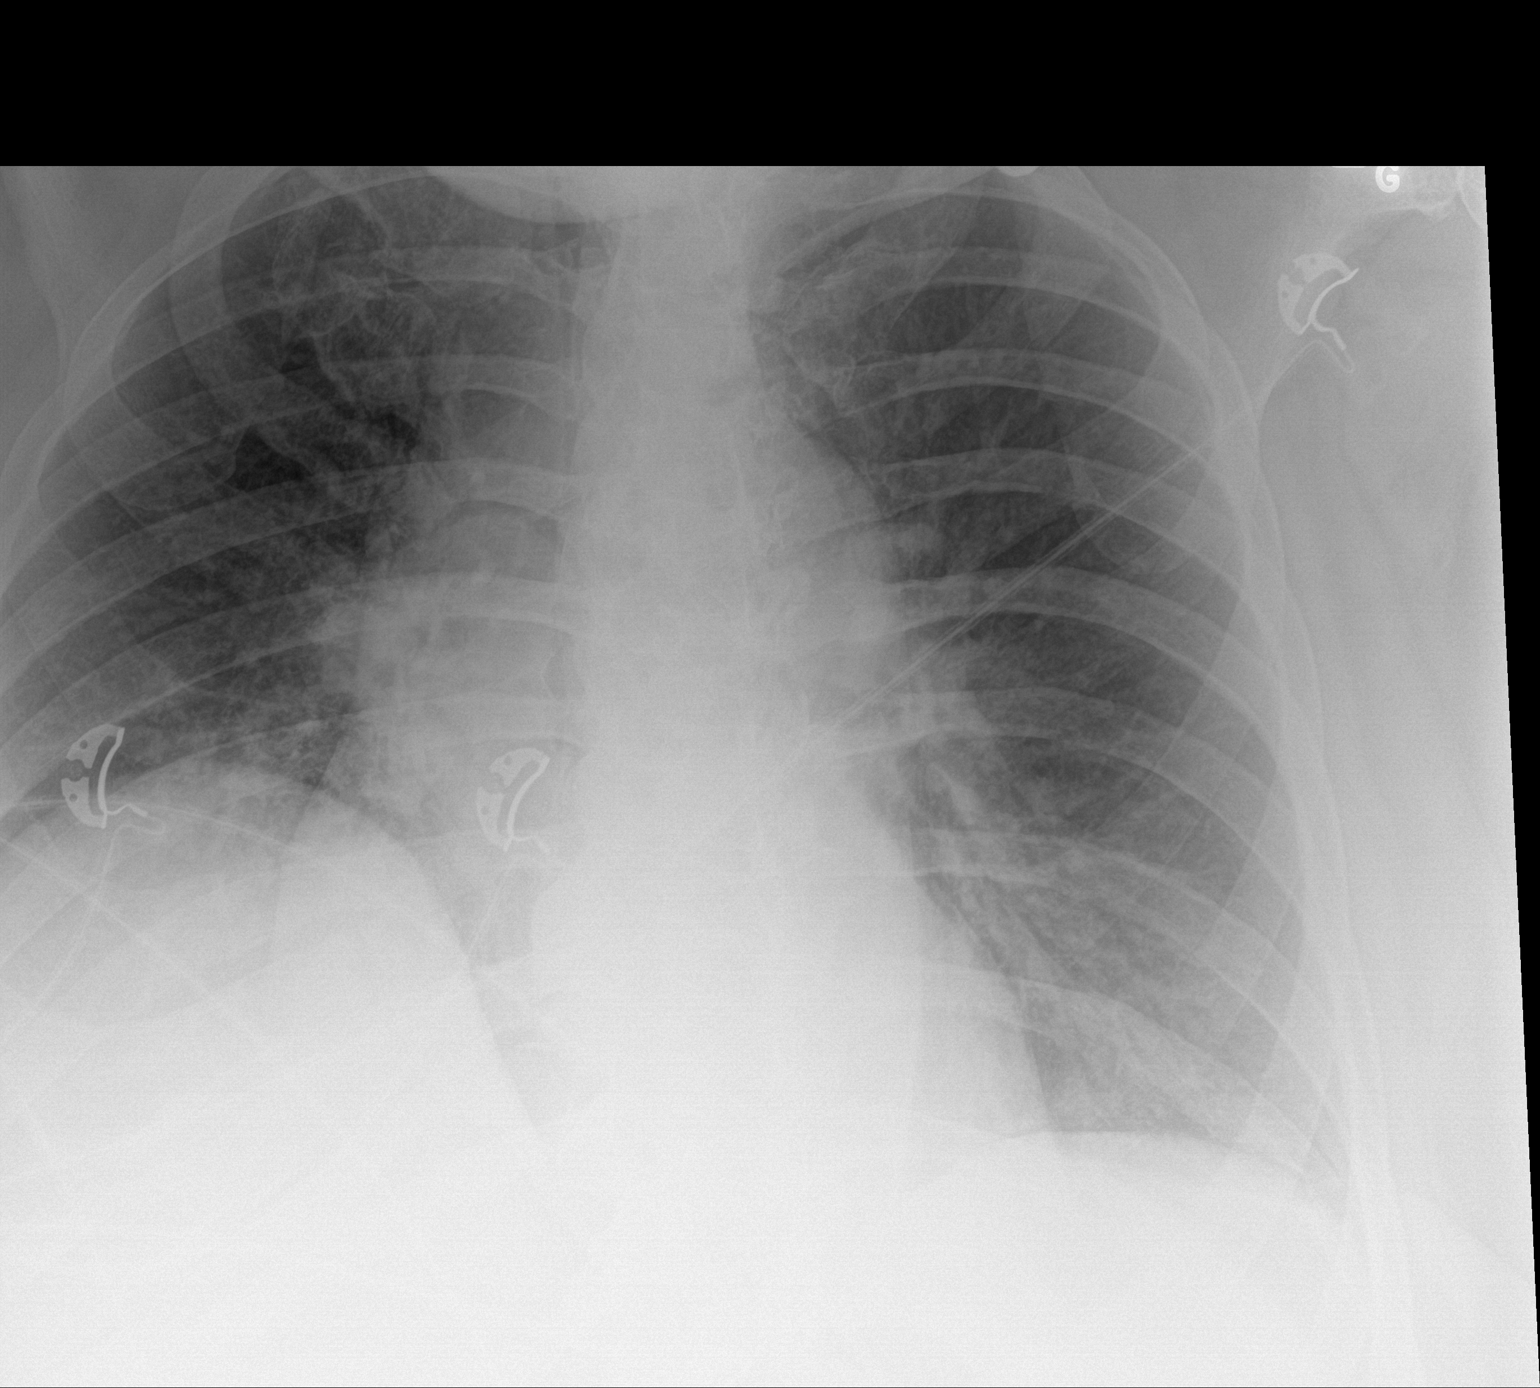

[2 of 2 positions shown; findings below may reference images not displayed]

FINDINGS: Eventration of the right hemidiaphragm. Heart is top-normal in size.
Minimal aortic atherosclerosis and mild central vascular congestion
is seen. No pulmonary consolidation, effusion or pneumothorax is
noted. The patient's chin obscures portions of the apices medially.
No acute osseous abnormality.
IMPRESSION: Borderline cardiomegaly with mild central vascular congestion. No
alveolar consolidation.
